# Patient Record
Sex: Female | Born: 1960 | Race: White | Hispanic: No | Marital: Married | Smoking: Never smoker
Health system: Southern US, Community
[De-identification: ages and names within clinical notes are randomized; demographics above are authoritative.]

## PROBLEM LIST (undated history)

## (undated) DIAGNOSIS — Z9889 Other specified postprocedural states: Secondary | ICD-10-CM

## (undated) DIAGNOSIS — I499 Cardiac arrhythmia, unspecified: Secondary | ICD-10-CM

## (undated) DIAGNOSIS — J45909 Unspecified asthma, uncomplicated: Secondary | ICD-10-CM

## (undated) DIAGNOSIS — M797 Fibromyalgia: Secondary | ICD-10-CM

## (undated) DIAGNOSIS — R51 Headache: Secondary | ICD-10-CM

## (undated) DIAGNOSIS — E876 Hypokalemia: Secondary | ICD-10-CM

## (undated) DIAGNOSIS — D649 Anemia, unspecified: Secondary | ICD-10-CM

## (undated) DIAGNOSIS — Z9289 Personal history of other medical treatment: Secondary | ICD-10-CM

## (undated) DIAGNOSIS — IMO0002 Reserved for concepts with insufficient information to code with codable children: Secondary | ICD-10-CM

## (undated) DIAGNOSIS — C8590 Non-Hodgkin lymphoma, unspecified, unspecified site: Secondary | ICD-10-CM

## (undated) DIAGNOSIS — N3010 Interstitial cystitis (chronic) without hematuria: Secondary | ICD-10-CM

## (undated) DIAGNOSIS — J449 Chronic obstructive pulmonary disease, unspecified: Secondary | ICD-10-CM

## (undated) HISTORY — PX: PORTACATH PLACEMENT: SHX2246

## (undated) HISTORY — DX: Interstitial cystitis (chronic) without hematuria: N30.10

## (undated) HISTORY — DX: Reserved for concepts with insufficient information to code with codable children: IMO0002

## (undated) HISTORY — PX: OTHER SURGICAL HISTORY: SHX169

## (undated) HISTORY — DX: Anemia, unspecified: D64.9

## (undated) HISTORY — PX: COLONOSCOPY: SHX174

## (undated) HISTORY — DX: Other specified postprocedural states: Z98.890

## (undated) HISTORY — DX: Unspecified asthma, uncomplicated: J45.909

## (undated) HISTORY — DX: Headache: R51

## (undated) HISTORY — DX: Chronic obstructive pulmonary disease, unspecified: J44.9

## (undated) HISTORY — DX: Fibromyalgia: M79.7

## (undated) SURGERY — Surgical Case
Anesthesia: *Unknown

---

## 2002-04-30 DIAGNOSIS — M797 Fibromyalgia: Secondary | ICD-10-CM

## 2002-04-30 DIAGNOSIS — N3010 Interstitial cystitis (chronic) without hematuria: Secondary | ICD-10-CM

## 2002-04-30 HISTORY — DX: Interstitial cystitis (chronic) without hematuria: N30.10

## 2002-04-30 HISTORY — DX: Fibromyalgia: M79.7

## 2002-07-20 ENCOUNTER — Ambulatory Visit (HOSPITAL_COMMUNITY): Admission: RE | Admit: 2002-07-20 | Discharge: 2002-07-20 | Payer: Self-pay | Admitting: Obstetrics & Gynecology

## 2002-07-20 ENCOUNTER — Encounter: Payer: Self-pay | Admitting: Obstetrics & Gynecology

## 2005-02-21 ENCOUNTER — Encounter: Admission: RE | Admit: 2005-02-21 | Discharge: 2005-02-21 | Payer: Self-pay | Admitting: Obstetrics & Gynecology

## 2006-02-14 ENCOUNTER — Ambulatory Visit (HOSPITAL_COMMUNITY): Admission: RE | Admit: 2006-02-14 | Discharge: 2006-02-14 | Payer: Self-pay | Admitting: Obstetrics & Gynecology

## 2006-04-30 DIAGNOSIS — G8929 Other chronic pain: Secondary | ICD-10-CM

## 2006-04-30 HISTORY — DX: Other chronic pain: G89.29

## 2007-06-06 ENCOUNTER — Encounter: Admission: RE | Admit: 2007-06-06 | Discharge: 2007-06-06 | Payer: Self-pay | Admitting: Obstetrics & Gynecology

## 2007-06-18 ENCOUNTER — Encounter: Admission: RE | Admit: 2007-06-18 | Discharge: 2007-06-18 | Payer: Self-pay | Admitting: Obstetrics & Gynecology

## 2008-04-30 DIAGNOSIS — C8590 Non-Hodgkin lymphoma, unspecified, unspecified site: Secondary | ICD-10-CM

## 2008-04-30 HISTORY — DX: Non-Hodgkin lymphoma, unspecified, unspecified site: C85.90

## 2009-02-23 ENCOUNTER — Encounter: Admission: RE | Admit: 2009-02-23 | Discharge: 2009-02-23 | Payer: Self-pay | Admitting: Obstetrics & Gynecology

## 2009-04-12 ENCOUNTER — Emergency Department (HOSPITAL_COMMUNITY): Admission: EM | Admit: 2009-04-12 | Discharge: 2009-04-12 | Payer: Self-pay | Admitting: Emergency Medicine

## 2009-04-30 DIAGNOSIS — Z9889 Other specified postprocedural states: Secondary | ICD-10-CM

## 2009-04-30 HISTORY — DX: Other specified postprocedural states: Z98.890

## 2009-04-30 HISTORY — PX: DILATION AND CURETTAGE OF UTERUS: SHX78

## 2009-06-10 ENCOUNTER — Ambulatory Visit (HOSPITAL_COMMUNITY): Admission: RE | Admit: 2009-06-10 | Discharge: 2009-06-10 | Payer: Self-pay | Admitting: Obstetrics & Gynecology

## 2010-04-10 ENCOUNTER — Other Ambulatory Visit
Admission: RE | Admit: 2010-04-10 | Discharge: 2010-04-10 | Payer: Self-pay | Source: Home / Self Care | Admitting: Vascular Surgery

## 2010-04-30 DIAGNOSIS — I499 Cardiac arrhythmia, unspecified: Secondary | ICD-10-CM

## 2010-04-30 HISTORY — DX: Cardiac arrhythmia, unspecified: I49.9

## 2010-05-21 ENCOUNTER — Encounter: Payer: Self-pay | Admitting: Obstetrics & Gynecology

## 2010-07-20 LAB — CBC
HCT: 38.2 % (ref 36.0–46.0)
Hemoglobin: 12.5 g/dL (ref 12.0–15.0)
MCHC: 32.7 g/dL (ref 30.0–36.0)
MCV: 79.4 fL (ref 78.0–100.0)
Platelets: 161 10*3/uL (ref 150–400)
RBC: 4.82 MIL/uL (ref 3.87–5.11)
RDW: 15.3 % (ref 11.5–15.5)
WBC: 5.3 10*3/uL (ref 4.0–10.5)

## 2010-08-01 LAB — URINALYSIS, ROUTINE W REFLEX MICROSCOPIC
Bilirubin Urine: NEGATIVE
Glucose, UA: NEGATIVE mg/dL
Hgb urine dipstick: NEGATIVE
Ketones, ur: 15 mg/dL — AB
Nitrite: NEGATIVE
Protein, ur: NEGATIVE mg/dL
Specific Gravity, Urine: 1.007 (ref 1.005–1.030)
Urobilinogen, UA: 0.2 mg/dL (ref 0.0–1.0)
pH: 8 (ref 5.0–8.0)

## 2010-08-01 LAB — COMPREHENSIVE METABOLIC PANEL
ALT: 13 U/L (ref 0–35)
AST: 17 U/L (ref 0–37)
Albumin: 3.9 g/dL (ref 3.5–5.2)
Alkaline Phosphatase: 95 U/L (ref 39–117)
BUN: 4 mg/dL — ABNORMAL LOW (ref 6–23)
CO2: 26 mEq/L (ref 19–32)
Calcium: 8.8 mg/dL (ref 8.4–10.5)
Chloride: 101 mEq/L (ref 96–112)
Creatinine, Ser: 0.89 mg/dL (ref 0.4–1.2)
GFR calc Af Amer: >60 mL/min (ref >60)
GFR calc non Af Amer: >60 mL/min (ref >60)
Glucose, Bld: 115 mg/dL — ABNORMAL HIGH (ref 70–99)
Potassium: 3.3 mEq/L — ABNORMAL LOW (ref 3.5–5.1)
Sodium: 136 mEq/L (ref 135–145)
Total Bilirubin: 0.8 mg/dL (ref 0.3–1.2)
Total Protein: 6.9 g/dL (ref 6.0–8.3)

## 2010-08-01 LAB — CBC
HCT: 39.2 % (ref 36.0–46.0)
Hemoglobin: 13.1 g/dL (ref 12.0–15.0)
MCHC: 33.5 g/dL (ref 30.0–36.0)
MCV: 79.7 fL (ref 78.0–100.0)
Platelets: 156 10*3/uL (ref 150–400)
RBC: 4.91 MIL/uL (ref 3.87–5.11)
RDW: 14.9 % (ref 11.5–15.5)
WBC: 8.6 10*3/uL (ref 4.0–10.5)

## 2010-08-01 LAB — DIFFERENTIAL
Basophils Absolute: 0 10*3/uL (ref 0.0–0.1)
Basophils Relative: 0 % (ref 0–1)
Eosinophils Absolute: 0 10*3/uL (ref 0.0–0.7)
Eosinophils Relative: 0 % (ref 0–5)
Lymphocytes Relative: 15 % (ref 12–46)
Lymphs Abs: 1.3 10*3/uL (ref 0.7–4.0)
Monocytes Absolute: 0.4 10*3/uL (ref 0.1–1.0)
Monocytes Relative: 5 % (ref 3–12)
Neutro Abs: 6.9 10*3/uL (ref 1.7–7.7)
Neutrophils Relative %: 80 % — ABNORMAL HIGH (ref 43–77)

## 2010-08-01 LAB — LIPASE, BLOOD: Lipase: 17 U/L (ref 11–59)

## 2010-09-21 ENCOUNTER — Other Ambulatory Visit: Payer: Self-pay | Admitting: Obstetrics & Gynecology

## 2010-09-21 DIAGNOSIS — Z1231 Encounter for screening mammogram for malignant neoplasm of breast: Secondary | ICD-10-CM

## 2010-09-27 ENCOUNTER — Ambulatory Visit
Admission: RE | Admit: 2010-09-27 | Discharge: 2010-09-27 | Disposition: A | Discharge status: Home / Self Care | Source: Ambulatory Visit | Attending: Obstetrics & Gynecology | Admitting: Obstetrics & Gynecology

## 2010-09-27 DIAGNOSIS — Z1231 Encounter for screening mammogram for malignant neoplasm of breast: Secondary | ICD-10-CM

## 2010-09-27 HISTORY — DX: Non-Hodgkin lymphoma, unspecified, unspecified site: C85.90

## 2011-04-12 DIAGNOSIS — IMO0002 Reserved for concepts with insufficient information to code with codable children: Secondary | ICD-10-CM

## 2011-04-12 DIAGNOSIS — R87613 High grade squamous intraepithelial lesion on cytologic smear of cervix (HGSIL): Secondary | ICD-10-CM

## 2011-04-12 HISTORY — DX: Reserved for concepts with insufficient information to code with codable children: IMO0002

## 2011-04-12 HISTORY — DX: High grade squamous intraepithelial lesion on cytologic smear of cervix (HGSIL): R87.613

## 2011-04-18 DIAGNOSIS — N3010 Interstitial cystitis (chronic) without hematuria: Secondary | ICD-10-CM

## 2011-04-18 HISTORY — DX: Interstitial cystitis (chronic) without hematuria: N30.10

## 2011-06-04 ENCOUNTER — Other Ambulatory Visit: Payer: Self-pay | Admitting: Obstetrics & Gynecology

## 2011-06-28 ENCOUNTER — Other Ambulatory Visit: Payer: Self-pay | Admitting: Obstetrics & Gynecology

## 2011-08-02 ENCOUNTER — Encounter: Payer: Self-pay | Admitting: Obstetrics and Gynecology

## 2011-08-08 ENCOUNTER — Encounter: Payer: Self-pay | Admitting: Obstetrics and Gynecology

## 2011-08-08 ENCOUNTER — Ambulatory Visit (INDEPENDENT_AMBULATORY_CARE_PROVIDER_SITE_OTHER): Admitting: Obstetrics and Gynecology

## 2011-08-08 VITALS — BP 110/60 | HR 80 | Temp 98.1°F | Resp 16 | Ht 61.0 in | Wt 141.0 lb

## 2011-08-08 DIAGNOSIS — R6889 Other general symptoms and signs: Secondary | ICD-10-CM

## 2011-08-08 DIAGNOSIS — IMO0002 Reserved for concepts with insufficient information to code with codable children: Secondary | ICD-10-CM | POA: Insufficient documentation

## 2011-08-08 DIAGNOSIS — C8589 Other specified types of non-Hodgkin lymphoma, extranodal and solid organ sites: Secondary | ICD-10-CM

## 2011-08-08 DIAGNOSIS — R87619 Unspecified abnormal cytological findings in specimens from cervix uteri: Secondary | ICD-10-CM | POA: Insufficient documentation

## 2011-08-08 DIAGNOSIS — C8590 Non-Hodgkin lymphoma, unspecified, unspecified site: Secondary | ICD-10-CM | POA: Insufficient documentation

## 2011-08-08 NOTE — Progress Notes (Signed)
Nl exam REPEAT PAP VISIT  Subjective:    Nicole Aguilar is a 51 y.o. female who presents as a new patient for consultation concerning abnormal paps. Review of her records reveal the following: 2004 Endometrial biopsy Normal 12/05 Pap   LGSIL 3/06 Cx Bx   LGSIL 3/06 ECC   Normal 10/06 Pap   LGSIL 10/06 GC/CHL  NEG 10/07 Pap   ASCUS 10/07 HR HPV  Detected 12/07 ECC   Squamous metaplasia 12/09 PAP   Normal 12/09 GC/CHL  NEG 10/10 Pap   ASCUS 10/10 HR HPV  Not Detected 2/11 Endomet curette Normal 5/12 Pap   ASCUS 5/12 HR HPV  Not Detected 12/12 Pap   HGSIL 2/13 CX BX   No Dysplasia 2/13 ECC   Neg CKC RECOMMENDED by GYN because of discrepancy between pap and biopsies       Objective:    External genitalia: normal Perianal skin: no external genital warts noted Vagina: normal without discharge Cervix: normal in appearance Uterus:  Normal sized, shape and consistency Adnexa:  No masses  Assessment and Plan:   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  Prior to that, most significant lesion was LGSIL. Pap/biopsy discrepancy with no dyplasia on most recent biopsy Single episode of HR HPV in 2007 with two negative screens since.  PLAN: HR HPV done at visit If positive, will proceed with CKC.  If neg, recommend colpo All pap records ordered  [facility] PMD 4/19/20132:47 PM

## 2011-08-08 NOTE — Patient Instructions (Signed)
PT NEEDS TO SIGN roi @ CHECKOUT

## 2011-08-10 ENCOUNTER — Encounter (HOSPITAL_COMMUNITY): Admission: RE | Payer: Self-pay | Source: Ambulatory Visit

## 2011-08-10 ENCOUNTER — Ambulatory Visit (HOSPITAL_COMMUNITY): Admission: RE | Admit: 2011-08-10 | Source: Ambulatory Visit | Admitting: Obstetrics & Gynecology

## 2011-08-10 SURGERY — CONE BIOPSY, CERVIX
Anesthesia: Choice

## 2011-08-17 ENCOUNTER — Encounter: Payer: Self-pay | Admitting: Obstetrics and Gynecology

## 2011-08-22 ENCOUNTER — Telehealth: Payer: Self-pay | Admitting: Obstetrics and Gynecology

## 2011-08-23 NOTE — Telephone Encounter (Signed)
Pc from pt. Told pt consulted with [facility] and rec colposcopy. HPV-not detected. Pt agrees. Appt sched 09/04/11@4 :15 with [facility]. Informed pt no IC or anything pv 24 prior to appt and can take OTC Tylenol or Ibuprofen prior to appt. Pt voices understanding.

## 2011-08-23 NOTE — Telephone Encounter (Signed)
Pt is electronic/no cht/ seen after 08/07/11

## 2011-08-23 NOTE — Telephone Encounter (Signed)
Lm on vm to cb per [facility] recs.

## 2011-08-23 NOTE — Telephone Encounter (Signed)
SEE TELEPHONE MESSAGE

## 2011-09-04 ENCOUNTER — Ambulatory Visit (INDEPENDENT_AMBULATORY_CARE_PROVIDER_SITE_OTHER): Admitting: Obstetrics and Gynecology

## 2011-09-04 ENCOUNTER — Encounter: Payer: Self-pay | Admitting: Obstetrics and Gynecology

## 2011-09-04 ENCOUNTER — Encounter

## 2011-09-04 VITALS — BP 90/60 | HR 70 | Resp 16 | Ht 61.0 in | Wt 146.0 lb

## 2011-09-04 DIAGNOSIS — R6889 Other general symptoms and signs: Secondary | ICD-10-CM

## 2011-09-04 DIAGNOSIS — IMO0002 Reserved for concepts with insufficient information to code with codable children: Secondary | ICD-10-CM

## 2011-09-04 NOTE — Patient Instructions (Signed)
Colposcopy Care After Colposcopy is a procedure in which a special tool is used to magnify the surface of the cervix. A tissue sample (biopsy) may also be taken. This sample will be looked at for cervical cancer or other problems. After the test:  You may have some cramping.   Lie down for a few minutes if you feel lightheaded.    You may have some bleeding which should stop in a few days.  HOME CARE  Do not have sex or use tampons for 2 to 3 days or as told.   Only take medicine as told by your doctor.   Continue to take your birth control pills as usual.  Finding out the results of your test Ask when your test results will be ready. Make sure you get your test results. GET HELP RIGHT AWAY IF:  You are bleeding a lot or are passing blood clots.   You develop a fever of 102 F (38.9 C) or higher.   You have abnormal vaginal discharge.   You have cramps that do not go away with medicine.   You feel lightheaded, dizzy, or pass out (faint).  MAKE SURE YOU:   Understand these instructions.   Will watch your condition.   Will get help right away if you are not doing well or get worse.  Document Released: 10/03/2007 Document Revised: 04/05/2011 Document Reviewed: 10/03/2007 Exit[employer] Patient Information 2012 [employer]. 

## 2011-09-04 NOTE — Progress Notes (Signed)
Colposcopy Procedure Note  Indications: Pap smear on December 2012 showed: high-grade squamous intraepithelial neoplasia  (HGSIL-encompassing moderate and severe dysplasia). Previous colposcopy: normal exam without visible pathology and in 2/13. Prior cervical treatment: no treatment. HR HPV from 4/13=not detected.  Procedure Details  The risks and benefits of the procedure and Written informed consent obtained.  Speculum placed in vagina and excellent visualization of cervix achieved, cervix swabbed x 3 with acetic acid solution.  Findings: Cervix: no visible lesions; endocervical speculum placed, endocervical curettage performed, cervical biopsies taken at 2 o'clock and specimen labelled and sent to pathology. Vaginal inspection: vaginal colposcopy not performed. Vulvar colposcopy: vulvar colposcopy not performed.  Specimens: 2 o'clock biopsy and ECC  Complications: none.  Plan: Specimens labelled and sent to Pathology. Telephone pt with results and F/U If bx is neg or LGSIL, will plan pap in 8/13.  If HGSIL will plan CKC  [facility] PMD

## 2011-09-17 ENCOUNTER — Telehealth: Payer: Self-pay

## 2011-09-17 NOTE — Telephone Encounter (Signed)
Tc to pt per colposcopy results. Told pt ECC=abnormal. Needs CKC per v[facility]. Pt aware surgery scheduler will call pt to sched appt. Pt voices understanding.

## 2011-09-20 ENCOUNTER — Telehealth: Payer: Self-pay | Admitting: Obstetrics and Gynecology

## 2011-09-27 ENCOUNTER — Other Ambulatory Visit: Payer: Self-pay | Admitting: Obstetrics and Gynecology

## 2011-09-27 ENCOUNTER — Telehealth: Payer: Self-pay | Admitting: Obstetrics and Gynecology

## 2011-09-27 NOTE — Telephone Encounter (Signed)
CKC scheduled for 10/19/11 @ 9:30 with [facility].  [employer] effective 08/29/11 will pay 100% as out of pocket max has been met per [place] [employer] on 09/25/11 -Adrianne Pridgen

## 2011-10-01 ENCOUNTER — Encounter (HOSPITAL_COMMUNITY): Payer: Self-pay | Admitting: Pharmacist

## 2011-10-08 ENCOUNTER — Encounter: Payer: Self-pay | Admitting: Obstetrics and Gynecology

## 2011-10-08 ENCOUNTER — Encounter (HOSPITAL_COMMUNITY)
Admission: RE | Admit: 2011-10-08 | Discharge: 2011-10-08 | Disposition: A | Discharge status: Home / Self Care | Source: Ambulatory Visit | Attending: Obstetrics and Gynecology | Admitting: Obstetrics and Gynecology

## 2011-10-08 ENCOUNTER — Ambulatory Visit (INDEPENDENT_AMBULATORY_CARE_PROVIDER_SITE_OTHER): Admitting: Obstetrics and Gynecology

## 2011-10-08 ENCOUNTER — Other Ambulatory Visit: Payer: Self-pay

## 2011-10-08 ENCOUNTER — Encounter (HOSPITAL_COMMUNITY): Payer: Self-pay

## 2011-10-08 VITALS — BP 102/60 | Temp 98.1°F | Ht 61.0 in | Wt 148.0 lb

## 2011-10-08 DIAGNOSIS — R519 Headache, unspecified: Secondary | ICD-10-CM

## 2011-10-08 DIAGNOSIS — G43709 Chronic migraine without aura, not intractable, without status migrainosus: Secondary | ICD-10-CM | POA: Insufficient documentation

## 2011-10-08 DIAGNOSIS — R6889 Other general symptoms and signs: Secondary | ICD-10-CM

## 2011-10-08 DIAGNOSIS — N951 Menopausal and female climacteric states: Secondary | ICD-10-CM | POA: Insufficient documentation

## 2011-10-08 DIAGNOSIS — R51 Headache: Secondary | ICD-10-CM

## 2011-10-08 HISTORY — DX: Hypokalemia: E87.6

## 2011-10-08 HISTORY — DX: Headache, unspecified: R51.9

## 2011-10-08 HISTORY — DX: Cardiac arrhythmia, unspecified: I49.9

## 2011-10-08 HISTORY — DX: Chronic migraine without aura, not intractable, without status migrainosus: G43.709

## 2011-10-08 HISTORY — DX: Personal history of other medical treatment: Z92.89

## 2011-10-08 LAB — BASIC METABOLIC PANEL
BUN: 11 mg/dL (ref 6–23)
Chloride: 100 mEq/L (ref 96–112)
Creatinine, Ser: 0.87 mg/dL (ref 0.50–1.10)
GFR calc Af Amer: 88 mL/min — ABNORMAL LOW (ref >90)
GFR calc non Af Amer: 76 mL/min — ABNORMAL LOW (ref >90)
Potassium: 4.2 mEq/L (ref 3.5–5.1)

## 2011-10-08 LAB — CBC
MCHC: 33.5 g/dL (ref 30.0–36.0)
Platelets: 125 10*3/uL — ABNORMAL LOW (ref 150–400)
RDW: 13 % (ref 11.5–15.5)
WBC: 3.8 10*3/uL — ABNORMAL LOW (ref 4.0–10.5)

## 2011-10-08 MED ORDER — HYDROCODONE-ACETAMINOPHEN 5-500 MG PO TABS: ORAL_TABLET | ORAL | Status: DC

## 2011-10-08 NOTE — Progress Notes (Signed)
Nicole Aguilar is a 51 year old white married female gravida 4 para2 who presents for preoperative examination.  Pt here today for Pre-Op for CKC for endocervical dysplasia at last colposcopyon 5/7/ 2013.the cervical biopsies showed only koilocytic atypia after a Pap smear showing high-grade squamous intraepithelial lesion. Review of her records reveal the following:  2004 Endometrial biopsy Normal  12/05 Pap LGSIL  3/06 Cx Bx LGSIL  3/06 ECC Normal  10/06 Pap LGSIL  10/06 GC/CHL NEG  10/07 Pap ASCUS  10/07 HR HPV Detected  12/07 ECC Squamous metaplasia  12/09 PAP Normal  12/09 GC/CHL NEG  10/10 Pap ASCUS  10/10 HR HPV Not Detected  2/11 Endomet curette Normal  5/12 Pap ASCUS  5/12 HR HPV Not Detected  12/12 Pap HGSIL  2/13 CX BX No Dysplasia  2/13 ECC Neg  Her second gynecologic concern is that of menopausal symptoms which have not been well relieved by either the CombiPatch ordered now Climara pro. She continues to have significant night sweats which cause her difficulty with sleep. Her night sweats have been especially severe since 2008. These also were seen to have been accompanied by the onset of headaches at that time which do not wax and wane during the month and are intermittent had one episode of severe orgasmic had a with some visual changes.  Pertinent Gynecological History: Menses: post-menopausal Bleeding: none DES exposure: denies Blood transfusions: see below Sexually transmitted diseases: no past history Previous GYN Procedures: DNC  Last mammogram: normal    Menstrual History:  Patient's last menstrual period was 02/28/2010.    Past Medical History  Diagnosis Date  . 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 Anemia   . Abnormal Pap smear, high grade squamous intraepithelial lesion (HGSIL) 04/12/11  . History of dilation and curettage 2011  . Chronic headaches 2008  . Interstitial cystitis 2004    Bladder instillations with Dr. Logan Bores  .  Fibromyalgia 2004    Dr. Logan Bores manages  . History of blood transfusion 1980s  . Dysrhythmia 2012    Holter monitor showed occ atrial flutter- no meds prescribed  . 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 Dilation and curettage of uterus 2011  . Colonoscopy   . Portacath placement     used for chemo.Marland KitchenMarland KitchenNOT for IV access    Family History  Problem Relation Age of Onset  . Heart disease Father   . Stroke Father   . Alzheimer's disease Father   . Diabetes Maternal Uncle   . Heart disease Maternal Grandmother   . Cancer Maternal Grandmother     kidney  . Heart disease Maternal Grandfather   . Heart disease Paternal Grandmother   . Cancer Paternal Aunt     bone & breast  . Alzheimer's disease Paternal Aunt     Social History:  reports that she has never smoked. She has never used smokeless tobacco. She reports that she does not drink alcohol or use illicit drugs.  Allergies:  Allergies  Allergen Reactions  . Shellfish Allergy Palpitations    Can use Betadine without difficulty    Review of Systems  Constitutional: Positive for diaphoresis. Negative for fever, chills, weight loss and malaise/fatigue.  HENT: Negative for hearing loss, ear pain, nosebleeds, congestion, sore throat, neck pain, tinnitus and ear discharge.   Eyes: Positive for photophobia. Negative for blurred vision, double vision,  pain, discharge and redness.       With headaches  Respiratory: Negative for cough, hemoptysis, sputum production, shortness of breath, wheezing and stridor.   Cardiovascular: Positive for palpitations. Negative for chest pain, orthopnea, claudication, leg swelling and PND.       Hx intermittent atrial flutter   Gastrointestinal: Negative for heartburn, nausea, vomiting, abdominal pain, diarrhea, constipation, blood in stool and melena.  Genitourinary: Positive for dysuria, urgency and frequency. Negative for hematuria  and flank pain.       History of interstitial cystitis  Musculoskeletal: Positive for myalgias. Negative for back pain, joint pain and falls.       History of fibromyalgia  Skin: Negative for itching and rash.  Neurological: Positive for headaches. Negative for dizziness, tingling, tremors, sensory change, speech change, focal weakness, seizures, loss of consciousness and weakness.  Endo/Heme/Allergies: Negative for environmental allergies and polydipsia. Does not bruise/bleed easily.  Psychiatric/Behavioral: Negative.     Blood pressure 102/60, temperature 98.1 F (36.7 C), temperature source Oral, height 5\' 1"  (1.549 m), weight 148 lb (67.132 kg), last menstrual period 02/28/2010.  Physical Exam  Constitutional: She is oriented to person, place, and time and well-developed, well-nourished, and in no distress.  HENT:  Head: Normocephalic and atraumatic.  Eyes: Conjunctivae and EOM are normal.  Neck: Normal range of motion. Neck supple.  Cardiovascular: Normal rate and regular rhythm.   Pulmonary/Chest: Effort normal and breath sounds normal.  Abdominal: Soft. Bowel sounds are normal.  Genitourinary: Vagina normal, uterus normal, cervix normal, right adnexa normal and left adnexa normal.  Musculoskeletal: Normal range of motion.  Neurological: She is alert and oriented to person, place, and time. Gait normal.  Skin: Skin is warm and dry.      Assessment/Plan Abnormal genital cytology with discordance between Pap smear and biopsies and most recently endocervical dysplasia Postmenopausal vasomotor symptoms Fibromyalgia Interstitial cystitis Headaches which seemed to have their onset at about the time of menopause  Recommendation Cold knife conization of the cervix is recommend.  The risks of anesthesia bleeding infection and damage to adjacent organs were all reviewed. The patient wishes to proceed. This will be performed on 621 2013 at [facility] Once the pathology returns  from cold knife conization the patient's hormone replacement therapy will be reviewed to allow increases in estrogen and to begin micronized progesterone 100 mg p.o. Q.h.s.  Carson Meche P 10/08/2011, 9:26 PM

## 2011-10-08 NOTE — H&P (Signed)
Nicole Aguilar is a 51 year old white married female gravida 4 para2 who presents for preoperative examination.  Pt here today for Pre-Op for CKC for endocervical dysplasia at last colposcopyon 5/7/ 2013.the cervical biopsies showed only koilocytic atypia after a Pap smear showing high-grade squamous intraepithelial lesion. Review of her records reveal the following:  2004 Endometrial biopsy Normal  12/05 Pap LGSIL  3/06 Cx Bx LGSIL  3/06 ECC Normal  10/06 Pap LGSIL  10/06 GC/CHL NEG  10/07 Pap ASCUS  10/07 HR HPV Detected  12/07 ECC Squamous metaplasia  12/09 PAP Normal  12/09 GC/CHL NEG  10/10 Pap ASCUS  10/10 HR HPV Not Detected  2/11 Endomet curette Normal  5/12 Pap ASCUS  5/12 HR HPV Not Detected  12/12 Pap HGSIL  2/13 CX BX No Dysplasia  2/13 ECC Neg 5/13 ECC CIN I, mild dysplasia 5/13 Cx Bx neg  Her second gynecologic concern is that of menopausal symptoms which have not been well relieved by either the CombiPatch ordered now Climara pro. She continues to have significant night sweats which cause her difficulty with sleep. Her night sweats have been especially severe since 2008. These also were seen to have been accompanied by the onset of headaches at that time which do not wax and wane during the month and are intermittent had one episode of severe orgasmic had a with some visual changes.  Pertinent Gynecological History: Menses: post-menopausal Bleeding: none DES exposure: denies Blood transfusions: see below Sexually transmitted diseases: no past history Previous GYN Procedures: DNC  Last mammogram: normal    Menstrual History:  Patient's last menstrual period was 02/28/2010.    Past Medical History  Diagnosis Date  . 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 Anemia   . Abnormal Pap smear, high grade squamous intraepithelial lesion (HGSIL) 04/12/11  . History of dilation and curettage 2011  . Chronic headaches 2008  . Interstitial cystitis 2004      Bladder instillations with Dr. Logan Bores  . Fibromyalgia 2004    Dr. Logan Bores manages  . History of blood transfusion 1980s  . Dysrhythmia 2012    Holter monitor showed occ atrial flutter- no meds prescribed  . 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 Dilation and curettage of uterus 2011  . Colonoscopy   . Portacath placement     used for chemo.Marland KitchenMarland KitchenNOT for IV access    Family History  Problem Relation Age of Onset  . Heart disease Father   . Stroke Father   . Alzheimer's disease Father   . Diabetes Maternal Uncle   . Heart disease Maternal Grandmother   . Cancer Maternal Grandmother     kidney  . Heart disease Maternal Grandfather   . Heart disease Paternal Grandmother   . Cancer Paternal Aunt     bone & breast  . Alzheimer's disease Paternal Aunt     Social History:  reports that she has never smoked. She has never used smokeless tobacco. She reports that she does not drink alcohol or use illicit drugs.  Allergies:  Allergies  Allergen Reactions  . Shellfish Allergy Palpitations    Can use Betadine without difficulty    Review of Systems  Constitutional: Positive for diaphoresis. Negative for fever, chills, weight loss and malaise/fatigue.  HENT: Negative for hearing loss, ear pain, nosebleeds, congestion, sore throat, neck pain, tinnitus and ear discharge.  Eyes: Positive for photophobia. Negative for blurred vision, double vision, pain, discharge and redness.       With headaches  Respiratory: Negative for cough, hemoptysis, sputum production, shortness of breath, wheezing and stridor.   Cardiovascular: Positive for palpitations. Negative for chest pain, orthopnea, claudication, leg swelling and PND.       Hx intermittent atrial flutter   Gastrointestinal: Negative for heartburn, nausea, vomiting, abdominal pain, diarrhea, constipation, blood in stool and melena.  Genitourinary: Positive for dysuria,  urgency and frequency. Negative for hematuria and flank pain.       History of interstitial cystitis  Musculoskeletal: Positive for myalgias. Negative for back pain, joint pain and falls.       History of fibromyalgia  Skin: Negative for itching and rash.  Neurological: Positive for headaches. Negative for dizziness, tingling, tremors, sensory change, speech change, focal weakness, seizures, loss of consciousness and weakness.  Endo/Heme/Allergies: Negative for environmental allergies and polydipsia. Does not bruise/bleed easily.  Psychiatric/Behavioral: Negative.     Blood pressure 102/60, temperature 98.1 F (36.7 C), temperature source Oral, height 5\' 1"  (1.549 m), weight 148 lb (67.132 kg), last menstrual period 02/28/2010. BP 111/76  Pulse 62  Temp 97.9 F (36.6 C) (Oral)  Resp 18  SpO2 100%  LMP 02/28/2010 on 10/19/11  Physical Exam  Constitutional: She is oriented to person, place, and time and well-developed, well-nourished, and in no distress.  HENT:  Head: Normocephalic and atraumatic.  Eyes: Conjunctivae and EOM are normal.  Neck: Normal range of motion. Neck supple.  Cardiovascular: Normal rate and regular rhythm.   Pulmonary/Chest: Effort normal and breath sounds normal.  Abdominal: Soft. Bowel sounds are normal.  Genitourinary: Vagina normal, uterus normal, cervix normal, right adnexa normal and left adnexa normal.  Musculoskeletal: Normal range of motion.  Neurological: She is alert and oriented to person, place, and time. Gait normal.  Skin: Skin is warm and dry.      Assessment/Plan Abnormal genital cytology with discordance between Pap smear and biopsies and most recently endocervical dysplasia Postmenopausal vasomotor symptoms Fibromyalgia Interstitial cystitis Headaches which seemed to have their onset at about the time of menopause  Recommendation Cold knife conization of the cervix is recommend.  The risks of anesthesia bleeding infection and damage  to adjacent organs were all reviewed. The patient wishes to proceed. This will be performed on 6/21/ 2013 at [facility] Once the pathology returns from cold knife conization the patient's hormone replacement therapy will be reviewed to allow increases in estrogen and to begin micronized progesterone 100 mg p.o. Q.h.s. Pre op assessment and cardiac echo done on 10/16/11 revealed pt an acceptable candidate for surgery.  Cadence Minton P 10/08/2011, 9:26 PM

## 2011-10-08 NOTE — Pre-Procedure Instructions (Signed)
I reported abnormal EKG to Dr. Rodman Pickle and she wants pt to get cardiology consult prior to surgery unless today's EKG is same as EKG in cardio office. I called [facility] in [place] and spoke with Dr. Albertine Patricia nurse who gave info to Dr. Kirtland Bouchard and he wants pt to see him on 10/16/11. ( office EKG doesn't show same as the one today) I called pt and gave her the appt time.

## 2011-10-08 NOTE — Patient Instructions (Signed)
YOUR PROCEDURE IS SCHEDULED ON:10/19/11  ENTER THROUGH THE MAIN ENTRANCE OF [facility] AT:8am  USE DESK PHONE AND DIAL [id number] TO INFORM us OF YOUR ARRIVAL  CALL [phone] IF YOU HAVE ANY QUESTIONS OR PROBLEMS PRIOR TO YOUR ARRIVAL.  REMEMBER: DO NOT EAT OR DRINK AFTER MIDNIGHT :Thursday   YOU MAY BRUSH YOUR TEETH THE MORNING OF SURGERY   TAKE THESE MEDICINES THE DAY OF SURGERY WITH SIP OF WATER:usual am meds   DO NOT WEAR JEWELRY, EYE MAKEUP, LIPSTICK OR DARK FINGERNAIL POLISH DO NOT WEAR LOTIONS  DO NOT SHAVE FOR 48 HOURS PRIOR TO SURGERY  YOU WILL NOT BE ALLOWED TO DRIVE YOURSELF HOME.  NAME OF DRIVER:Ward Audria Nine

## 2011-10-19 ENCOUNTER — Ambulatory Visit (HOSPITAL_COMMUNITY)
Admission: RE | Admit: 2011-10-19 | Discharge: 2011-10-19 | Disposition: A | Discharge status: Home / Self Care | Source: Ambulatory Visit | Attending: Obstetrics and Gynecology | Admitting: Obstetrics and Gynecology

## 2011-10-19 ENCOUNTER — Encounter (HOSPITAL_COMMUNITY): Payer: Self-pay | Admitting: *Deleted

## 2011-10-19 ENCOUNTER — Encounter (HOSPITAL_COMMUNITY): Payer: Self-pay | Admitting: Anesthesiology

## 2011-10-19 ENCOUNTER — Encounter (HOSPITAL_COMMUNITY)
Admission: RE | Disposition: A | Discharge status: Home / Self Care | Payer: Self-pay | Source: Ambulatory Visit | Attending: Obstetrics and Gynecology

## 2011-10-19 ENCOUNTER — Ambulatory Visit (HOSPITAL_COMMUNITY): Admitting: Anesthesiology

## 2011-10-19 DIAGNOSIS — N870 Mild cervical dysplasia: Secondary | ICD-10-CM | POA: Insufficient documentation

## 2011-10-19 DIAGNOSIS — N879 Dysplasia of cervix uteri, unspecified: Secondary | ICD-10-CM

## 2011-10-19 HISTORY — PX: CERVICAL CONIZATION W/BX: SHX1330

## 2011-10-19 LAB — PREGNANCY, URINE: Preg Test, Ur: NEGATIVE

## 2011-10-19 SURGERY — CONE BIOPSY, CERVIX
Anesthesia: General | Site: Cervix | Wound class: Clean Contaminated

## 2011-10-19 MED ORDER — IBUPROFEN 600 MG PO TABS: 600.0000 mg | ORAL_TABLET | Freq: Four times a day (QID) | ORAL | Status: AC

## 2011-10-19 MED ORDER — PROPOFOL 10 MG/ML IV EMUL
INTRAVENOUS | Status: DC | PRN
  Administered 2011-10-19: 150 mg via INTRAVENOUS
  Administered 2011-10-19: 50 mg via INTRAVENOUS

## 2011-10-19 MED ORDER — KETOROLAC TROMETHAMINE 30 MG/ML IJ SOLN: 15.0000 mg | Freq: Once | INTRAMUSCULAR | Status: DC | PRN

## 2011-10-19 MED ORDER — KETOROLAC TROMETHAMINE 60 MG/2ML IM SOLN
INTRAMUSCULAR | Status: AC
  Filled 2011-10-19: qty 2

## 2011-10-19 MED ORDER — FENTANYL CITRATE 0.05 MG/ML IJ SOLN: 25.0000 ug | INTRAMUSCULAR | Status: DC | PRN

## 2011-10-19 MED ORDER — LACTATED RINGERS IV SOLN
INTRAVENOUS | Status: DC
  Administered 2011-10-19 (×2): via INTRAVENOUS

## 2011-10-19 MED ORDER — LIDOCAINE HCL (CARDIAC) 20 MG/ML IV SOLN
INTRAVENOUS | Status: DC | PRN
  Administered 2011-10-19: 60 mg via INTRAVENOUS

## 2011-10-19 MED ORDER — KETOROLAC TROMETHAMINE 60 MG/2ML IM SOLN
INTRAMUSCULAR | Status: DC | PRN
  Administered 2011-10-19: 30 mg via INTRAMUSCULAR

## 2011-10-19 MED ORDER — ONDANSETRON HCL 4 MG/2ML IJ SOLN
INTRAMUSCULAR | Status: DC | PRN
  Administered 2011-10-19: 4 mg via INTRAVENOUS

## 2011-10-19 MED ORDER — MIDAZOLAM HCL 5 MG/5ML IJ SOLN
INTRAMUSCULAR | Status: DC | PRN
  Administered 2011-10-19: 2 mg via INTRAVENOUS

## 2011-10-19 MED ORDER — DEXAMETHASONE SODIUM PHOSPHATE 4 MG/ML IJ SOLN
INTRAMUSCULAR | Status: DC | PRN
  Administered 2011-10-19: 10 mg via INTRAVENOUS

## 2011-10-19 MED ORDER — FENTANYL CITRATE 0.05 MG/ML IJ SOLN
INTRAMUSCULAR | Status: AC
  Filled 2011-10-19: qty 2

## 2011-10-19 MED ORDER — EPHEDRINE SULFATE 50 MG/ML IJ SOLN
INTRAMUSCULAR | Status: DC | PRN
  Administered 2011-10-19: 5 mg via INTRAVENOUS

## 2011-10-19 MED ORDER — LIDOCAINE HCL 2 % IJ SOLN
INTRAMUSCULAR | Status: DC | PRN
  Administered 2011-10-19: 10 mL

## 2011-10-19 MED ORDER — PROPOFOL 10 MG/ML IV EMUL
INTRAVENOUS | Status: AC
  Filled 2011-10-19: qty 20

## 2011-10-19 MED ORDER — LIDOCAINE HCL (CARDIAC) 20 MG/ML IV SOLN
INTRAVENOUS | Status: AC
  Filled 2011-10-19: qty 5

## 2011-10-19 MED ORDER — VASOPRESSIN 20 UNIT/ML IJ SOLN
INTRAVENOUS | Status: DC | PRN
  Administered 2011-10-19: 11:00:00 via INTRAMUSCULAR

## 2011-10-19 MED ORDER — DEXAMETHASONE SODIUM PHOSPHATE 10 MG/ML IJ SOLN
INTRAMUSCULAR | Status: AC
  Filled 2011-10-19: qty 1

## 2011-10-19 MED ORDER — LIDOCAINE HCL 2 % IJ SOLN
INTRAMUSCULAR | Status: AC
  Filled 2011-10-19: qty 1

## 2011-10-19 MED ORDER — IODINE STRONG (LUGOLS) 5 % PO SOLN
ORAL | Status: DC | PRN
  Administered 2011-10-19: 0.2 mL via ORAL

## 2011-10-19 MED ORDER — FENTANYL CITRATE 0.05 MG/ML IJ SOLN
INTRAMUSCULAR | Status: DC | PRN
  Administered 2011-10-19 (×2): 100 ug via INTRAVENOUS

## 2011-10-19 MED ORDER — MIDAZOLAM HCL 2 MG/2ML IJ SOLN
INTRAMUSCULAR | Status: AC
  Filled 2011-10-19: qty 2

## 2011-10-19 MED ORDER — GLYCOPYRROLATE 0.2 MG/ML IJ SOLN
INTRAMUSCULAR | Status: AC
  Filled 2011-10-19: qty 1

## 2011-10-19 MED ORDER — ONDANSETRON HCL 4 MG/2ML IJ SOLN
INTRAMUSCULAR | Status: AC
  Filled 2011-10-19: qty 2

## 2011-10-19 MED ORDER — VASOPRESSIN 20 UNIT/ML IJ SOLN
INTRAMUSCULAR | Status: AC
  Filled 2011-10-19: qty 1

## 2011-10-19 MED ORDER — KETOROLAC TROMETHAMINE 30 MG/ML IJ SOLN
INTRAMUSCULAR | Status: DC | PRN
  Administered 2011-10-19: 30 mg via INTRAVENOUS

## 2011-10-19 MED ORDER — EPHEDRINE 5 MG/ML INJ
INTRAVENOUS | Status: AC
  Filled 2011-10-19: qty 10

## 2011-10-19 SURGICAL SUPPLY — 27 items
APPLICATOR COTTON TIP 6IN STRL (MISCELLANEOUS) ×6 IMPLANT
BLADE SURG 11 STRL SS (BLADE) ×2 IMPLANT
CLOTH BEACON ORANGE TIMEOUT ST (SAFETY) ×2 IMPLANT
CONTAINER PREFILL 10% NBF 60ML (FORM) ×2 IMPLANT
COUNTER NEEDLE 1200 MAGNETIC (NEEDLE) ×2 IMPLANT
DILATOR CANAL MILEX (MISCELLANEOUS) ×2 IMPLANT
ELECT LLETZ BALL 3MM DISP (ELECTRODE) IMPLANT
ELECT REM PT RETURN 9FT ADLT (ELECTROSURGICAL)
ELECTRODE REM PT RTRN 9FT ADLT (ELECTROSURGICAL) IMPLANT
GAUZE SPONGE 4X4 16PLY XRAY LF (GAUZE/BANDAGES/DRESSINGS) IMPLANT
GLOVE SURG SS PI 6.5 STRL IVOR (GLOVE) ×4 IMPLANT
GOWN PREVENTION PLUS LG XLONG (DISPOSABLE) ×4 IMPLANT
NEEDLE SPNL 22GX3.5 QUINCKE BK (NEEDLE) ×4 IMPLANT
PACK VAGINAL MINOR WOMEN LF (CUSTOM PROCEDURE TRAY) ×2 IMPLANT
PENCIL BUTTON HOLSTER BLD 10FT (ELECTRODE) IMPLANT
SCOPETTES 8  STERILE (MISCELLANEOUS) ×2
SCOPETTES 8 STERILE (MISCELLANEOUS) ×2 IMPLANT
SPONGE SURGIFOAM ABS GEL 12-7 (HEMOSTASIS) ×2 IMPLANT
SUT VIC AB 0 CT1 18XCR BRD8 (SUTURE) ×1 IMPLANT
SUT VIC AB 0 CT1 8-18 (SUTURE)
SUT VIC AB 0 CT2 27 (SUTURE) IMPLANT
SYR CONTROL 10ML LL (SYRINGE) ×4 IMPLANT
SYR TB 1ML 27GX1/2 SAFE (SYRINGE) ×2 IMPLANT
SYR TB 1ML 27GX1/2 SAFETY (SYRINGE) ×4
TOWEL OR 17X24 6PK STRL BLUE (TOWEL DISPOSABLE) ×4 IMPLANT
TUBING NON-CON 1/4 X 20 CONN (TUBING) ×2 IMPLANT
YANKAUER SUCT BULB TIP NO VENT (SUCTIONS) ×2 IMPLANT

## 2011-10-19 NOTE — Discharge Instructions (Signed)
Conization of the Cervix Conization is the cutting (excision) of a cone-shaped portion of the cervix. This procedure is usually done when there is abnormal bleeding from the cervix. It can also be done to evaluate an abnormal Pap smear or if an abnormality is seen on the cervix during an exam. Conization of the cervix is not done during a menstrual period or pregnancy. BEFORE THE PROCEDURE  Do not eat or drink anything for 6 to 8 hours before the procedure, especially if you are going to be given a drug to make you sleep (general anesthetic).   Do not take aspirin or blood thinners for at least a week before the procedure, or as directed.   Arrive at least an hour before the procedure to read and sign the necessary forms.   Arrange for someone to take you home after the procedure.   If you smoke, do not smoke for 2 weeks before the procedure.  LET YOUR CAREGIVER KNOW THE FOLLOWING:  Allergies to food or medications.   All the medications you are taking including over-the-counter and prescription medications, herbs, eyedrops, and creams.   You develop a cold or an infection.   If you are using illegal drugs or drinking too much alcohol.   Your smoking habits.   Previous problems with anesthetics including novocaine.   The possibility of being pregnant.   History of blood clots or other bleeding problems.   Other medical problems.  RISKS AND COMPLICATIONS   Bleeding.   Infection.   Damage to the cervix.   Injury to surrounding organs.   Problems with the anesthesia.  PROCEDURE Conization of the cervix can be done by:  Cold knife. This type cuts out the cervical canal and the transformation zone (where the normal cells end and the abnormal cells begin) with a scalpel.   LEEP (electrocautery). This type is done with a thin wire that can cut and burn (cauterize) the cervical tissue with an electrical current.   Laser treatment. This type cuts and burns (cauterizes) the  tissue of the cervix to prevent bleeding with a laser beam.  You will be given a drug to make you sleep (general anesthetic) for cold knife and laser treatments, and you will be given a numbing medication (local anesthetic) for the LEEP procedure. Conization is usually done using colposcopy. Colposcopy magnifies the cervix to see it more clearly. The tissue removed is examined under a microscope by a doctor (pathologist). The pathologist will provide a report to your caregiver. This will help your caregiver decide if further treatment is necessary. This report will also help your caregiver decide on the best treatment if your results are abnormal.  AFTER THE PROCEDURE  If you had a general anesthetic, you may be groggy for a couple of hours after the procedure.   If you had a local anesthetic, you will be advised to rest at the surgical center or caregiver's office until you are stable and feel ready to go home.   Have someone take you home.   You may have some cramping for a couple of days.   You may have a bloody discharge or light bleeding for 2 to 4 weeks.   You may have a black discharge coming from the vagina. This is from the paste used on the cervix to prevent bleeding. This is normal discharge.  HOME CARE INSTRUCTIONS   Do not drive for 24 hours.   Avoid strenuous activities and exercises for at least 7 -   10 days.   Only take over-the-counter or prescription medicines for pain, discomfort, or fever as directed by your caregiver.   Do not take aspirin. It can cause or aggravate bleeding.   You may resume your usual diet.   Drink 6 to 8 glasses of fluid a day.   Rest and sleep the first 24 to 48 hours.   Take showers instead of baths until your caregiver gives you the okay.   Do not use tampons, douche or have intercourse for 4 weeks, or as advised by your caregiver.   Do not lift anything over 10 pounds (4.5 kg) for at least 7 to 10 days, or as advised by your caregiver.     Take your temperature twice a day, for 4 to 5 days. Write them down.   Do not drink or drive when taking pain medication.   If you develop constipation:   Take a mild laxative with the advice of your caregiver.   Eat bran foods.   Drink a lot of fluids.   Try to have someone with you or available for you the first 24 to 48 hours, especially if you had a general anesthetic.   Make sure you and your family understand everything about your surgery and recovery.   Follow your caregiver's advice regarding follow-up appointments and Pap smears.  SEEK MEDICAL CARE IF:   You develop a rash.   You are dizzy or light-headed.   You feel sick to your stomach (nauseous).   You develop a bad smelling vaginal discharge.   You have an abnormal reaction or allergy to your medication.   You need stronger pain medication.  SEEK IMMEDIATE MEDICAL CARE IF:   You have blood clots or bleeding that is heavier than a normal menstrual period, or you develop bright red bleeding.   An oral temperature above 102 F (38.9 C) develops and persists for several hours.   You have increasing cramps.   You pass out.   You have painful or bloody urine.   You start throwing up (vomiting).   The pain is not relieved with your pain medication.  Not all test results are available during your visit. If your test results are not back during your the visit, make an appointment with your caregiver to find out the results. Do not assume everything is normal if you have not heard from your caregiver or the medical facility. It is important for you to follow up on all of your test results. Document Released: 01/24/2005 Document Revised: 04/05/2011 Document Reviewed: 11/15/2008 Exit[employer] Patient Information 2012 [employer]. 

## 2011-10-19 NOTE — Transfer of Care (Signed)
Immediate Anesthesia Transfer of Care Note  Patient: Nicole Aguilar  Procedure(s) Performed: Procedure(s) (LRB): CONIZATION CERVIX WITH BIOPSY (N/A)  Patient Location: PACU  Anesthesia Type: General  Level of Consciousness: awake, alert  and oriented  Airway & Oxygen Therapy: Patient Spontanous Breathing and Patient connected to nasal cannula oxygen  Post-op Assessment: Report given to PACU RN and Post -op Vital signs reviewed and stable  Post vital signs: Reviewed and stable  Complications: No apparent anesthesia complications

## 2011-10-19 NOTE — Anesthesia Procedure Notes (Signed)
Procedure Name: LMA Insertion Date/Time: 10/19/2011 10:40 AM Performed by: Graciela Husbands Pre-anesthesia Checklist: Suction available, Timeout performed, Emergency Drugs available, Patient identified and Patient being monitored Patient Re-evaluated:Patient Re-evaluated prior to inductionOxygen Delivery Method: Circle system utilized Preoxygenation: Pre-oxygenation with 100% oxygen Intubation Type: IV induction LMA: LMA inserted LMA Size: 4.0 Number of attempts: 1 Placement Confirmation: positive ETCO2 and breath sounds checked- equal and bilateral Tube secured with: Tape Dental Injury: Teeth and Oropharynx as per pre-operative assessment

## 2011-10-19 NOTE — Op Note (Signed)
Pre-operative Diagnosis:  Endocervical dysplasia  Post-operative Diagnosis: same  Surgeon: Surgeon(s) and Role:    * Hal Morales, MD - Primary   Procedure and Anesthesia:  Procedure(s) and Anesthesia Type:    * CONIZATION CERVIX WITH BIOPSY - General  ASA Class: 2  Specimens to pathology: [facility] specimen     Endocervical curettings  Findings:  There was no  nonstaining area on the exocervix. The cervix sounded to 7 cm.  There were no exocervical lesions.  Procedure:  The patient was taken to the operating room after appropriate identification and placed on the operating table. After the attainment of adequate general anesthesia she was placed in the lithotomy position. The perineum and vagina were prepped with surgiprep and in and out catheterization undertaken .  The perineum was draped as a sterile field. A weighted speculum was placed in the posterior vagina. A paracervical block was achieved with a total of 10 cc of 2% Xylocaine and the 5 and 7:00 positions. The single-tooth tenaculum was placed on the anterior cervix outside the transition zone.  Sutures of 0 Vicryl were placed at the 3 and 9:00 positions on the cervix and tied down.  The cervix was injected with a dilute solution of Pitressin.  A total of 27 cc was used.  A cone shaped excisional biopsy of the cervix was undertaken including the endocervical canal, and marked at the 12:00 position with a suture. The excised specimen was removed from the operative field.  The endocervical canal space was then curetted for a minimal amount of tissue.  Sutures of 0 Vicryl were then placed at the 6 and 12:00 positions. Hemostasis was achieved once placement of a suture at the 9:00 position was completed.  A piece of Gelfoam was placed and the conization bed.  All instruments were removed from the vagina and the patient awakened from general anesthesia.  She was taken to the recovery room in satisfactory condition having tolerated the procedure  well.  Sponge and instrument counts were correct.

## 2011-10-19 NOTE — Anesthesia Preprocedure Evaluation (Signed)
Anesthesia Evaluation  Patient identified by MRN, date of birth, ID band Patient awake    Reviewed: Allergy & Precautions, H&P , NPO status , Patient's Chart, lab work & pertinent test results, reviewed documented beta blocker date and time   History of Anesthesia Complications Negative for: history of anesthetic complications  Airway Mallampati: II TM Distance: >3 FB Neck ROM: full    Dental  (+) Teeth Intact   Pulmonary neg pulmonary ROS,  breath sounds clear to auscultation  Pulmonary exam normal       Cardiovascular Exercise Tolerance: Good + dysrhythmias (a. flutter on holter.  no meds.  palpitations several times a week.  resolve spontaneously) Atrial Fibrillation Rhythm:regular Rate:Normal  EKG showed RBB.  Echo 10/16/2011 showed EF 63%, normal valves.  Cleared by cardiologist for surgery.   Neuro/Psych  Headaches (headaches - daily),  Neuromuscular disease (fibromyalgia) negative psych ROS   GI/Hepatic negative GI ROS, Neg liver ROS,   Endo/Other  negative endocrine ROS  Renal/GU negative Renal ROS Bladder dysfunction (interstitial cystitis)      Musculoskeletal   Abdominal   Peds  Hematology Lymphoma - completed chemo in 10/2010   Anesthesia Other Findings   Reproductive/Obstetrics negative OB ROS                           Anesthesia Physical Anesthesia Plan  ASA: II  Anesthesia Plan: General LMA   Post-op Pain Management:    Induction:   Airway Management Planned:   Additional Equipment:   Intra-op Plan:   Post-operative Plan:   Informed Consent: I have reviewed the patients History and Physical, chart, labs and discussed the procedure including the risks, benefits and alternatives for the proposed anesthesia with the patient or authorized representative who has indicated his/her understanding and acceptance.   Dental Advisory Given  Plan Discussed with: CRNA and  Surgeon  Anesthesia Plan Comments:         Anesthesia Quick Evaluation

## 2011-10-21 NOTE — Anesthesia Postprocedure Evaluation (Signed)
  Anesthesia Post-op Note  Patient: Nicole Aguilar  Procedure(s) Performed: Procedure(s) (LRB): CONIZATION CERVIX WITH BIOPSY (N/A) Patient is awake and responsive. Pain and nausea are reasonably well controlled. Vital signs are stable and clinically acceptable. Oxygen saturation is clinically acceptable. There are no apparent anesthetic complications at this time. Patient is ready for discharge.

## 2011-10-22 ENCOUNTER — Encounter (HOSPITAL_COMMUNITY): Payer: Self-pay | Admitting: Obstetrics and Gynecology

## 2011-10-24 DIAGNOSIS — R300 Dysuria: Secondary | ICD-10-CM | POA: Insufficient documentation

## 2011-10-24 DIAGNOSIS — R3915 Urgency of urination: Secondary | ICD-10-CM | POA: Insufficient documentation

## 2011-10-24 DIAGNOSIS — N9489 Other specified conditions associated with female genital organs and menstrual cycle: Secondary | ICD-10-CM

## 2011-10-24 DIAGNOSIS — K589 Irritable bowel syndrome without diarrhea: Secondary | ICD-10-CM | POA: Insufficient documentation

## 2011-10-24 DIAGNOSIS — R350 Frequency of micturition: Secondary | ICD-10-CM | POA: Insufficient documentation

## 2011-10-24 DIAGNOSIS — M6289 Other specified disorders of muscle: Secondary | ICD-10-CM

## 2011-10-24 DIAGNOSIS — R109 Unspecified abdominal pain: Secondary | ICD-10-CM

## 2011-10-24 DIAGNOSIS — IMO0002 Reserved for concepts with insufficient information to code with codable children: Secondary | ICD-10-CM | POA: Insufficient documentation

## 2011-10-24 HISTORY — DX: Irritable bowel syndrome, unspecified: K58.9

## 2011-10-24 HISTORY — DX: Other specified conditions associated with female genital organs and menstrual cycle: N94.89

## 2011-10-24 HISTORY — DX: Unspecified abdominal pain: R10.9

## 2011-10-24 HISTORY — DX: Dysuria: R30.0

## 2011-10-24 HISTORY — DX: Other specified disorders of muscle: M62.89

## 2011-11-05 ENCOUNTER — Encounter: Payer: Self-pay | Admitting: Obstetrics and Gynecology

## 2011-11-05 ENCOUNTER — Ambulatory Visit (INDEPENDENT_AMBULATORY_CARE_PROVIDER_SITE_OTHER): Admitting: Obstetrics and Gynecology

## 2011-11-05 VITALS — BP 122/62 | Temp 98.2°F | Ht 61.0 in | Wt 148.0 lb

## 2011-11-05 DIAGNOSIS — Z79890 Hormone replacement therapy (postmenopausal): Secondary | ICD-10-CM

## 2011-11-05 MED ORDER — ESTRADIOL-NORETHINDRONE ACET 0.05-0.14 MG/DAY TD PTTW: 1.0000 | MEDICATED_PATCH | TRANSDERMAL | Status: DC

## 2011-11-05 NOTE — Patient Instructions (Signed)
Hormone Therapy At menopause, your body begins making less estrogen and progesterone hormones. This causes the body to stop having menstrual periods. This is because estrogen and progesterone hormones control your periods and menstrual cycle. A lack of estrogen may cause symptoms such as:  Hot flushes (or hot flashes).   Vaginal dryness.   Dry skin.   Loss of sex drive.   Risk of bone loss (osteoporosis).  When this happens, you may choose to take hormone therapy to get back the estrogen lost during menopause. When the hormone estrogen is given alone, it is usually referred to as ET (Estrogen Therapy). When the hormone progestin is combined with estrogen, it is generally called HT (Hormone Therapy). This was formerly known as hormone replacement therapy (HRT). Your caregiver can help you make a decision on what will be best for you. The decision to use HT seems to change often as new studies are done. Many studies do not agree on the benefits of hormone replacement therapy. LIKELY BENEFITS OF HT INCLUDE PROTECTION FROM:  Hot Flushes (also called hot flashes) - A hot flush is a sudden feeling of heat that spreads over the face and body. The skin may redden like a blush. It is connected with sweats and sleep disturbance. Women going through menopause may have hot flushes a few times a month or several times per day depending on the woman.   Osteoporosis (bone loss)- Estrogen helps guard against bone loss. After menopause, a woman's bones slowly lose calcium and become weak and brittle. As a result, bones are more likely to break. The hip, wrist, and spine are affected most often. Hormone therapy can help slow bone loss after menopause. Weight bearing exercise and taking calcium with vitamin D also can help prevent bone loss. There are also medications that your caregiver can prescribe that can help prevent osteoporosis.   Vaginal Dryness - Loss of estrogen causes changes in the vagina. Its lining  may become thin and dry. These changes can cause pain and bleeding during sexual intercourse. Dryness can also lead to infections. This can cause burning and itching. (Vaginal estrogen treatment can help relieve pain, itching, and dryness.)   Urinary Tract Infections are more common after menopause because of lack of estrogen. Some women also develop urinary incontinence because of low estrogen levels in the vagina and bladder.   Possible other benefits of estrogen include a positive effect on mood and short-term memory in women.  RISKS AND COMPLICATIONS  Using estrogen alone without progesterone causes the lining of the uterus to grow. This increases the risk of lining of the uterus (endometrial) cancer. Your caregiver should give another hormone called progestin if you have a uterus.   Women who take combined (estrogen and progestin) HT appear to have an increased risk of breast cancer. The risk appears to be small, but increases throughout the time that HT is taken.   Combined therapy also makes the breast tissue slightly denser which makes it harder to read mammograms (breast X-rays).   Combined, estrogen and progesterone therapy can be taken together every day, in which case there may be spotting of blood. HT therapy can be taken cyclically in which case you will have menstrual periods. Cyclically means HT is taken for a set amount of days, then not taken, then this process is repeated.   HT may increase the risk of stroke, heart attack, breast cancer and forming blood clots in your leg.   Transdermal estrogen (estrogen that is absorbed   through the skin with a patch or a cream) may have more positive results with:   Cholesterol.   Blood pressure.   Blood clots.  Having the following conditions may indicate you should not have HT:  Endometrial cancer.   Liver disease.   Breast cancer.   Heart disease.   History of blood clots.   Stroke.  TREATMENT   If you choose to take HT  and have a uterus, usually estrogen and progestin are prescribed.   Your caregiver will help you decide the best way to take the medications.   Possible ways to take estrogen include:   Pills.   Patches.   Gels.   Sprays.   Vaginal estrogen cream, rings and tablets.   It is best to take the lowest dose possible that will help your symptoms and take them for the shortest period of time that you can.   Hormone therapy can help relieve some of the problems (symptoms) that affect women at menopause. Before making a decision about HT, talk to your caregiver about what is best for you. Be well informed and comfortable with your decisions.  HOME CARE INSTRUCTIONS   Follow your caregivers advice when taking the medications.   A Pap test is done to screen for cervical cancer.   The first Pap test should be done at age 21.   Between ages 21 and 29, Pap tests are repeated every 2 years.   Beginning at age 30, you are advised to have a Pap test every 3 years as long as your past 3 Pap tests have been normal.   Some women have medical problems that increase the chance of getting cervical cancer. Talk to your caregiver about these problems. It is especially important to talk to your caregiver if a new problem develops soon after your last Pap test. In these cases, your caregiver may recommend more frequent screening and Pap tests.   The above recommendations are the same for women who have or have not gotten the vaccine for HPV (Human Papillomavirus).   If you had a hysterectomy for a problem that was not a cancer or a condition that could lead to cancer, then you no longer need Pap tests. However, even if you no longer need a Pap test, a regular exam is a good idea to make sure no other problems are starting.    If you are between ages 65 and 70, and you have had normal Pap tests going back 10 years, you no longer need Pap tests. However, even if you no longer need a Pap test, a regular  exam is a good idea to make sure no other problems are starting.    If you have had past treatment for cervical cancer or a condition that could lead to cancer, you need Pap tests and screening for cancer for at least 20 years after your treatment.   If Pap tests have been discontinued, risk factors (such as a new sexual partner) need to be re-assessed to determine if screening should be resumed.   Some women may need screenings more often if they are at high risk for cervical cancer.   Get mammograms done as per the advice of your caregiver.  SEEK IMMEDIATE MEDICAL CARE IF:  You develop abnormal vaginal bleeding.   You have pain or swelling in your legs, shortness of breath, or chest pain.   You develop dizziness or headaches.   You have lumps or changes in your breasts or   armpits.   You have slurred speech.   You develop weakness or numbness of your arms or legs.   You have pain, burning, or bleeding when urinating.   You develop abdominal pain.  Document Released: 01/13/2003 Document Revised: 04/05/2011 Document Reviewed: 05/03/2010 [facility] Patient Information 2012 [place], [place].Hormonal Therapy for Women, Frequently Asked Questions WHAT IS HORMONE THERAPY? Hormone therapy (HT), estrogen and progesterone, provides women with the female hormones that decrease and are lost as women get older. When the hormone estrogen is given alone, it is usually referred to as "ERT." When the hormone progesterone is combined with estrogen, it is generally called "HT." Previously this was known as hormone replacement therapy (HRT). Estrogen is a female hormone that brings about changes in various organs in the body. Progesterone is a female hormone that prepares the uterus for a pregnancy each month. During the change-over to menopause ("perimenopause") these hormone levels start to decrease. This causes many uncomfortable symptoms (see below). When the ovaries stop producing estrogen and  progesterone, menstrual periods come to an end. At this point, the woman has experienced menopause. Menopause is complete when a woman misses 12 consecutive menstrual periods. WHAT ARE THE BENEFITS OF HORMONE THERAPY? Hormone therapy has been used to relieve the short-term symptoms of menopause. These include:  Hot flashes.   Depression.   Memory loss.   Correcting irregular menstrual periods.   Night sweats.   Tiredness.   Mood disturbances.   Thinning of scalp hair.   Disturbed sleep.   Vaginal dryness.   Painful intercourse.   Loss of breast tissue.  Evidence shows that HT may be helpful in preventing colon cancer and bone loss (osteoporosis). WHAT ARE THE SHORT-TERM RISKS OF HORMONE THERAPY?  Some women report side effects from taking Hormone Therapy, including:   Feeling sick to stomach (nausea).   Fluid retention.   Swollen breasts.   Acne, when taking HT with progesterone.   Unusual vaginal discharge and bleeding (if the uterus is present).   Headaches.   Some women think HT will make them gain weight. Research now shows this is not true. Some women do gain weight during menopause, but this is because their metabolism slows down as they age. They also may not be increasing their amount or level of physical activity as they get older.   Short-term benefits or side effects should become noticeable within days, weeks, or sometimes months after treatment begins.  LONG-TERM RISKS These will not be easily noticeable for each individual woman. There are many factors involved that can contribute to long-term risks and side effects. CANCER There is concern that HT can increase the risk of some cancers, including endometrial cancer (lining of the uterus), breast, and certain (but not all) ovarian or cervix cancers, such as endometriod ovarian cancer.  When estrogen is taken alone, it raises the risk of endometrial cancer, if the uterus is still present. Adding  progestin with estrogen (HT) can greatly reduce this risk. Progestin is added to prevent the overgrowth (hyperplasia) of cells in the uterine lining. Women who still have an intact uterus are generally given this combined therapy and should not take estrogen hormone alone without progesterone. HT with estrogen and progestin has been linked to an increased risk of invasive breast cancer. Women who use estrogen plus progestin for four years or longer are more likely to develop breast cancer than women who have not used them for as long. This indicates that the therapy may have a cumulative effect. The  decision to take HT should be based on an overall look at the risk and benefits, and how they fit with your personal and genetic health profile. Conditions that increase the underlying risk of developing breast cancer include:  Family history of breast cancer.   Early age of the first menstrual period (menarche).   Late age of child bearing.   High fat diet.   Late menopause.   Obesity.   Increased breast density on mammograms.   Certain non-cancerous (benign) breast lesions.   Excessive use of alcohol.   Extensive radiation exposure to the chest.  These factors need to be considered when deciding to take HT. If you are currently taking HT and have concerns, talk with your caregiver as soon as possible.  BREAST DENSITY Taking both estrogen and progestin also can affect a woman's breast density. Increased breast density from HT makes it hard for a radiologist to read some special breast x-rays (mammograms). This leads to the need for follow-up mammograms, ultrasound or MRI (magnetic resonance imaging), or taking breast tissue samples that are surgically removed (biopsies). Increased density also is a concern because studies have shown that women age 76 and older, whose mammograms show at least 75 percent dense tissue, are at increased risk for breast cancer. However, it is not known if increased  breast density due to HT carries the same risk for breast cancer as having naturally dense breasts. About 25 percent of women who use combined HT have an increase in breast density on their mammograms. This is compared to about 8 percent of women taking estrogen alone. One study showed that stopping HT for about 2 weeks before having a mammogram improved the readability of the mammogram. But further research is needed to confirm the usefulness of this approach. HEART DISEASE In the past, taking HT (estrogen plus progestin) was thought to help protect women against heart disease. However, recent findings show that taking HT poses more risks than benefits. HT could increase a woman's risk for:  Heart disease.   Stroke.   Blood clot in the lung (pulmonary embolism).   Breast cancer.   Blood clots in the legs.  Women who have gone through menopause should not be given HT to prevent heart disease and other chronic conditions.  Women who have gone through menopause and who have heart disease, may have a greater risk of another cardiac event (like heart attack) after starting HT, at least in the short-term. For women who have had strokes, their risk for having another stroke goes up when they start taking HT. Hormones are not recommended for women with heart disease or for women who have had a stroke. If you have gone through menopause, talk with your caregiver about whether hormones are right for you. You can check the [facility] website (http://hoffman.com/) for updates on postmenopausal hormone therapy. OTHER RISKS INCLUDE:  Developing high blood pressure.   Developing gallbladder disease.   Women with a fibroid non-cancerous tumor on the uterus may develop pain, bleeding or increase growth of the fibroid.  If you are taking HT, watch for signs of trouble. These include:  Abnormal bleeding.   Breast lumps, bloody discharge or red/painful breasts.   Shortness  of breath.   Dizziness.   Abdominal pain.   Severe headaches.   Pain in your calves or chest.  Report these signs to your caregiver right away. Also, talk with your caregiver about how often you should have an exam. DOES THE  DURATION OF TAKING HT AFFECT BREAST CANCER RISK? The relationship between a woman's risk of developing breast cancer and the length of time that she receives HT is not clear. Some women take HT for only a few years until the worst of their menopausal symptoms have passed. Others have taken it for 10 years or more. Some researchers believe that there is little or no increased risk of breast cancer associated with short-term use of either HT with estrogen alone or estrogen combined with progestin. But long-term use is linked to an increased risk. Women on HT should continue to do monthly self breast exams and get their mammograms as recommended by their caregiver. WHY IS MENOPAUSAL HORMONE THERAPY USED IN SPITE OF THE CANCER RISK? The known benefits of HT can improve the quality of life for many women, by reducing uncomfortable symptoms, as mentioned above. There also is evidence that HT helps prevent and treats osteoporosis. There is preliminary evidence that it can help prevent other problems associated with age, including colon cancer. The addition of progestin to the treatment has greatly reduced the risk of uterine cancer. ARE THERE OTHER DRUG THERAPIES KNOWN TO TREAT CONDITIONS RELATED TO MENOPAUSE? A class of antidepressant drugs called Selective Serotonin Reuptake Inhibitors (SSRIs) are effective in treating menopause-related symptoms of depression or mood changes. Vitamin E and Clonidine (drug typically used for high blood pressure) can help reduce hot flashes. To prevent osteoporosis, women who are at high risk for bone loss may be given drugs such as bisphosphonates, alendronate, raloxifene, calcium with vitamin D, calcitonin, and prescription medicines such as fasomax or  boneva. Lastly, a class of cholesterol-lowering drugs called HMG-CoA-reductase inhibitors (statins) are proven to be effective for reducing risk of heart disease. They are also being explored to prevent osteoporosis. No alternatives to estrogen exist for prevention of colon cancer - a disease for which early evidence suggests HT may be beneficial. WHO SHOULD NOT USE HT?  HT is often not recommended for women who have any of the following conditions:  Vaginal bleeding of an unknown cause.   Suspected breast cancer or history of breast cancer.   History of endometrial or uterine cancer.   Chronic disease of the liver.   History of heart disease.   History of blood clots in the veins or legs or in the lung (venous thrombosis). This includes women who have had thrombosis or blood clots during pregnancy or when taking birth control pills. Although the risk of blood clots in women is very low, HT increases the risk.   Severe or uncontrolled high blood pressure.   Anyone who may be pregnant.  HOW CAN I SORT THROUGH THE BENEFITS AND RISKS TO MAKE A GOOD DECISION ABOUT WHETHER OR NOT TO USE POSTMENOPAUSAL HORMONE THERAPY? Here are several helpful points, summarizing the findings of the [facility]) study:  First, it is important to know that because the study involved healthy women, only a small number of them had either a negative or positive effect from estrogen plus progestin therapy. The percentages describe what would happen to a whole population, not necessarily to any individual woman. Second, remember that percentages are not fate. Whether expressing risks or benefits, a percentage does not mean you will develop a disease. Many factors affect that likelihood, including:  Your lifestyle.   Environmental factors.   Heredity.   Your personal medical history.  Realize that most treatments carry risks and benefits. No one can make a treatment choice for you. Talk  with your  caregiver and decide what is best for your health and quality of life. Begin by finding out your family history and your personal risk profile for:  Heart disease.   Stroke.   Breast cancer.   Osteoporosis.   Colorectal cancer.   Blood clots.   Other medical conditions.  Document Released: 01/13/2003 Document Revised: 04/05/2011 Document Reviewed: 02/14/2009 [facility] Patient Information 2012 [place], [place].

## 2011-11-05 NOTE — Progress Notes (Signed)
Surgery followup: DATE OF SURGERY: 10/19/2011 TYPE OF SURGERY: [facility]C PAIN:Yes Lower Back Cramping VAG BLEEDING: yes VAG DISCHARGE: yes NORMAL GI FUNCTN: no cramping NORMAL GU FUNCTN: no  PATHOLOGY REPORT 1. Cervix, cone - BENIGN CERVICAL MUCOSA, SEE COMMENT. - NEGATIVE FOR INTRAEPITHELIAL LESION OR MALIGNANCY. - ECTOCERVICAL AND ENDOCERVICAL MARGINS, NEGATIVE FOR DYSPLASIA OR MALIGNANCY. 2. Endocervix, curettage - NO ENDOCERVICAL TISSUE PRESENT, SEE COMMENT. Microscopic Comment 1. The history of cervical dysplasia and the recent cervical biopsy demonstrated benign findings ([id number]) are noted. In the current LEEP, there is no squamous intraepithelial lesion identified within numerous recut tissue sections. In addition, there is no aberrant expression of p16 immunostain. There is no previous Pap smear for comparison. (CRR:eps 10/24/11) 2. There was only blood-tinged mucus present in the specimen container formalin solution. The formalin solution was filtered and a cell block made. No endocervical tissue was present in the cell block. (CRR:eps 10/24/11) [place] RUND DO C/o hot flashes unrelieved by Climara pro. The hot flashes are also associated with headaches which the patient had had prior to menopause as well.  BP 122/62  Temp 98.2 F (36.8 C) (Oral)  Ht 5\' 1"  (1.549 m)  Wt 148 lb (67.132 kg)  BMI 27.96 kg/m2  LMP 02/28/2010  Pelvic exam: VULVA: normal appearing vulva with no masses, tenderness or lesions, VAGINA: normal appearing vagina with normal color and discharge, no lesions, vaginal discharge - bloody, CERVIX: , healing well, UTERUS: uterus is normal size, shape, consistency and nontender, ADNEXA: normal adnexa in size, nontender and no masses.  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  No evidence of vaginal lesions that may be source of dysplasia.  History of normal endometrial  biopsies Vasomotor symptoms not relieved by Climara pro Probable hormonal headache Continued light vaginal bleeding 2 weeks status post cold knife conization without evidence of infection  Recommendation: Abstain from vaginal intercourse until there has been no bleeding for one week Discontinue Climara pro Begin CombiPatch 0.05/ 0.14  twice weekly  followup in 6 weeks Next Pap smear due in 6 months

## 2011-12-27 ENCOUNTER — Encounter: Admitting: Obstetrics and Gynecology

## 2012-01-03 ENCOUNTER — Other Ambulatory Visit: Payer: Self-pay | Admitting: Obstetrics and Gynecology

## 2012-01-03 DIAGNOSIS — Z1231 Encounter for screening mammogram for malignant neoplasm of breast: Secondary | ICD-10-CM

## 2012-01-07 ENCOUNTER — Ambulatory Visit
Admission: RE | Admit: 2012-01-07 | Discharge: 2012-01-07 | Disposition: A | Discharge status: Home / Self Care | Source: Ambulatory Visit | Attending: Obstetrics and Gynecology | Admitting: Obstetrics and Gynecology

## 2012-01-07 DIAGNOSIS — Z1231 Encounter for screening mammogram for malignant neoplasm of breast: Secondary | ICD-10-CM

## 2013-02-11 DIAGNOSIS — J329 Chronic sinusitis, unspecified: Secondary | ICD-10-CM

## 2013-02-11 DIAGNOSIS — N760 Acute vaginitis: Secondary | ICD-10-CM

## 2013-02-11 HISTORY — DX: Chronic sinusitis, unspecified: J32.9

## 2013-02-11 HISTORY — DX: Acute vaginitis: N76.0

## 2013-07-14 ENCOUNTER — Other Ambulatory Visit: Payer: Self-pay

## 2013-07-14 ENCOUNTER — Ambulatory Visit
Admission: RE | Admit: 2013-07-14 | Discharge: 2013-07-14 | Disposition: A | Discharge status: Home / Self Care | Source: Ambulatory Visit

## 2013-07-14 DIAGNOSIS — Z1231 Encounter for screening mammogram for malignant neoplasm of breast: Secondary | ICD-10-CM

## 2013-07-15 ENCOUNTER — Other Ambulatory Visit: Payer: Self-pay | Admitting: Obstetrics and Gynecology

## 2013-07-15 DIAGNOSIS — R928 Other abnormal and inconclusive findings on diagnostic imaging of breast: Secondary | ICD-10-CM

## 2013-07-28 ENCOUNTER — Other Ambulatory Visit

## 2013-07-28 ENCOUNTER — Encounter

## 2013-07-29 ENCOUNTER — Ambulatory Visit
Admission: RE | Admit: 2013-07-29 | Discharge: 2013-07-29 | Disposition: A | Discharge status: Home / Self Care | Source: Ambulatory Visit | Attending: Obstetrics and Gynecology | Admitting: Obstetrics and Gynecology

## 2013-07-29 DIAGNOSIS — R928 Other abnormal and inconclusive findings on diagnostic imaging of breast: Secondary | ICD-10-CM

## 2014-01-12 DIAGNOSIS — R928 Other abnormal and inconclusive findings on diagnostic imaging of breast: Secondary | ICD-10-CM | POA: Insufficient documentation

## 2014-03-01 ENCOUNTER — Encounter: Payer: Self-pay | Admitting: Obstetrics and Gynecology

## 2014-10-28 DIAGNOSIS — Z95828 Presence of other vascular implants and grafts: Secondary | ICD-10-CM | POA: Insufficient documentation

## 2014-10-28 DIAGNOSIS — T82898A Other specified complication of vascular prosthetic devices, implants and grafts, initial encounter: Secondary | ICD-10-CM | POA: Insufficient documentation

## 2015-03-04 DIAGNOSIS — M5481 Occipital neuralgia: Secondary | ICD-10-CM

## 2015-03-04 DIAGNOSIS — M7918 Myalgia, other site: Secondary | ICD-10-CM | POA: Insufficient documentation

## 2015-03-04 DIAGNOSIS — M461 Sacroiliitis, not elsewhere classified: Secondary | ICD-10-CM | POA: Insufficient documentation

## 2015-03-04 HISTORY — DX: Myalgia, other site: M79.18

## 2015-03-04 HISTORY — DX: Sacroiliitis, not elsewhere classified: M46.1

## 2015-03-04 HISTORY — DX: Occipital neuralgia: M54.81

## 2015-12-23 DIAGNOSIS — R112 Nausea with vomiting, unspecified: Secondary | ICD-10-CM | POA: Insufficient documentation

## 2015-12-23 DIAGNOSIS — K529 Noninfective gastroenteritis and colitis, unspecified: Secondary | ICD-10-CM | POA: Insufficient documentation

## 2015-12-23 DIAGNOSIS — R6881 Early satiety: Secondary | ICD-10-CM

## 2015-12-23 HISTORY — DX: Noninfective gastroenteritis and colitis, unspecified: K52.9

## 2015-12-23 HISTORY — DX: Early satiety: R68.81

## 2015-12-23 HISTORY — DX: Nausea with vomiting, unspecified: R11.2

## 2016-06-27 DIAGNOSIS — IMO0002 Reserved for concepts with insufficient information to code with codable children: Secondary | ICD-10-CM | POA: Insufficient documentation

## 2016-12-13 ENCOUNTER — Ambulatory Visit (INDEPENDENT_AMBULATORY_CARE_PROVIDER_SITE_OTHER)

## 2016-12-13 ENCOUNTER — Other Ambulatory Visit: Payer: Self-pay | Admitting: Podiatry

## 2016-12-13 ENCOUNTER — Ambulatory Visit (INDEPENDENT_AMBULATORY_CARE_PROVIDER_SITE_OTHER): Admitting: Podiatry

## 2016-12-13 ENCOUNTER — Encounter: Payer: Self-pay | Admitting: Podiatry

## 2016-12-13 VITALS — BP 136/86 | HR 69

## 2016-12-13 DIAGNOSIS — M6740 Ganglion, unspecified site: Secondary | ICD-10-CM

## 2016-12-13 DIAGNOSIS — M79672 Pain in left foot: Secondary | ICD-10-CM | POA: Diagnosis not present

## 2016-12-13 NOTE — Progress Notes (Signed)
   Subjective:    Patient ID: Nicole Aguilar, female    DOB: Apr 19, 1961, 56 y.o.   MRN: [id number]  HPI  Chief Complaint  Patient presents with  . Foot Pain    Lt foot/ swelling top of foot below toes, toes throbbing       Review of Systems  Constitutional: Positive for fatigue.  HENT: Positive for congestion.   Cardiovascular:       Calf pain with walking  Gastrointestinal: Positive for constipation, diarrhea, nausea and vomiting.  Endocrine: Positive for cold intolerance and heat intolerance.  Genitourinary: Positive for frequency and urgency.  Musculoskeletal: Positive for arthralgias, back pain and myalgias.  Allergic/Immunologic: Positive for food allergies.  Neurological: Positive for dizziness, weakness, light-headedness and headaches.  Hematological: Bruises/bleeds easily.  All other systems reviewed and are negative.      Objective:   Physical Exam        Assessment & Plan:

## 2016-12-14 LAB — CYTOLOGY, FLUID (SOLSTAS)

## 2016-12-14 NOTE — Progress Notes (Signed)
Subjective:    Patient ID: Nicole Aguilar, female   DOB: 56 y.o.   MRN: [id number]   HPI patient presents stating she's developed edema and a area of enlargement around the second interspace and does not remember injury    Review of Systems  All other systems reviewed and are negative.       Objective:  Physical Exam  Constitutional: She appears well-developed and well-nourished.  Cardiovascular: Intact distal pulses.   Musculoskeletal: Normal range of motion.  Neurological: She is alert.  Skin: Skin is warm.  Nursing note and vitals reviewed.  Neurovascular status intact muscle strength was adequate range of motion within normal limits with patient noted to have 2 month history of mass in the second interspace left that measures approximately 1 cm x 0.5 cm and appears to be movable within subcutaneous tissue. It is mildly tender when pressed and does bother her with shoe gear     Assessment:   Probability for ganglionic cyst formation left versus congealed hematoma or other soft tissue mass      Plan:    H&P and x-ray reviewed. Today I did a proximal nerve block of this area I then aspirated it getting out approximately is 2 mL of gelatinous fluid and I applied a small amount steroid with sterile dressing. This was sent off for pathological evaluation and we will get those results and discuss with her  X-rays were negative for signs that there is a fracture negative for signs of there is any kind of bone injury

## 2017-01-03 ENCOUNTER — Ambulatory Visit: Admitting: Podiatry

## 2017-01-08 ENCOUNTER — Ambulatory Visit (INDEPENDENT_AMBULATORY_CARE_PROVIDER_SITE_OTHER): Admitting: Podiatry

## 2017-01-08 DIAGNOSIS — M84375A Stress fracture, left foot, initial encounter for fracture: Secondary | ICD-10-CM | POA: Diagnosis not present

## 2017-01-08 DIAGNOSIS — M6740 Ganglion, unspecified site: Secondary | ICD-10-CM

## 2017-01-14 NOTE — Progress Notes (Signed)
   Subjective:    Patient ID: Nicole Aguilar, female    DOB: 1961/02/14, 56 y.o.   MRN: [id number]  HPI Chief Complaint  Patient presents with  . Ganglion Cyst    fu ganglion cyst seems to be gone   . Foot Pain    still having a lot of pain   feels like being stabbed when pushing off   . Toe Pain    still having pain in the left 4th toe     56 y.o. female returns for the above complaint. Had cyst aspirated at last visit, reports that she is still having pain in her L foot. States that she thought the pain was from the cyst but now with that gone she feels like the pain is more localized a little further back on her foot. Still having pain in her L 4th toe. States the cyst has not recurred.  Review of Systems     Objective:   Physical Exam  There were no vitals filed for this visit. General AA&O x3. Normal mood and affect.  Vascular Dorsalis pedis and posterior tibial pulses  present 2+ bilaterally  Capillary refill normal to all digits. Pedal hair growth normal.  Neurologic Epicritic sensation grossly present.  Dermatologic No open lesions. Interspaces clear of maceration. Nails well groomed and normal in appearance.  Orthopedic: MMT 5/5 in dorsiflexion, plantarflexion, inversion, and eversion. Normal joint ROM without pain or crepitus. Pain on palpation L 4th metatarsal area with + tuning fork.     Fluid Cytology 8/16: Cytology, Fluid  Order: [id number]  Status:  Final result  Dx:  Ganglion cyst  Component 67mo ago  FINAL DIAGNOSIS: SEE NOTE   Comment: -  NO MALIGNANT CELLS IDENTIFIED.  Rare mononuclear cells/histiocytes in a background of mucoid/myxoid  material suggestive of ganglion cyst, clinical correlation required         Assessment & Plan:  Patient was evaluated and treated with all questions answered.  Ganglion Cyst, L Foot -Cytology results reviewed with patient -Cyst has not recurred as of yet. Discussed with patient possible recurrence.  Stress  Fracture L 4th Metatarsal -+Tuning fork test, pain s/o possible stress fx. -Will immobilize in a CAM boot for 4 weeks. Medically necessary for immobilization and protection.  Return in about 4 weeks (around 02/05/2017).

## 2017-02-05 ENCOUNTER — Ambulatory Visit (INDEPENDENT_AMBULATORY_CARE_PROVIDER_SITE_OTHER): Admitting: Podiatry

## 2017-02-05 ENCOUNTER — Ambulatory Visit (INDEPENDENT_AMBULATORY_CARE_PROVIDER_SITE_OTHER)

## 2017-02-05 DIAGNOSIS — M84375D Stress fracture, left foot, subsequent encounter for fracture with routine healing: Secondary | ICD-10-CM

## 2017-02-05 DIAGNOSIS — G5762 Lesion of plantar nerve, left lower limb: Secondary | ICD-10-CM | POA: Diagnosis not present

## 2017-02-11 NOTE — Progress Notes (Signed)
  Subjective:  Patient ID: Nicole Aguilar, female    DOB: 09/18/1960,  MRN: [id number]  Chief Complaint  Patient presents with  . Foot Pain    4wk fu cyst and possible stress fracture    56 y.o. female returns for the above complaint. States that she has been feeling much better than previous but she still has some residual pain in the left foot. At this time it more near her toes.  Objective:  There were no vitals filed for this visit. General AA&O x3. Normal mood and affect.  Vascular Dorsalis pedis and posterior tibial pulses  present 1+ bilaterally  Capillary refill normal to all digits. Pedal hair growth normal.  Neurologic Epicritic sensation grossly present.  Dermatologic No open lesions. Interspaces clear of maceration. Nails well groomed and normal in appearance. Negative Tinel sign left third interspace   Orthopedic: MMT 5/5 in dorsiflexion, plantarflexion, inversion, and eversion. Normal joint ROM without pain or crepitus. Mulder's click L 3rd interspace.   Assessment & Plan:  Patient was evaluated and treated and all questions answered.  Stress Fracture, L -Improving. D/C CAM boot to patient tolerance.  Morton's Neuroma, L -Injection delivered as below.  Procedure: Neuroma Injection Location: Left 3rd interspace Skin Prep: Alcohol. Injectate: 0.5 cc 0.5% marcaine plain, 0.5 cc dexamethasone phosphate. Disposition: Patient tolerated procedure well. Injection site dressed with a band-aid.

## 2017-03-05 ENCOUNTER — Ambulatory Visit: Admitting: Podiatry

## 2017-12-05 DIAGNOSIS — H538 Other visual disturbances: Secondary | ICD-10-CM | POA: Insufficient documentation

## 2017-12-05 DIAGNOSIS — R413 Other amnesia: Secondary | ICD-10-CM | POA: Insufficient documentation

## 2020-02-02 ENCOUNTER — Other Ambulatory Visit: Payer: Self-pay | Admitting: Obstetrics and Gynecology

## 2020-03-01 ENCOUNTER — Encounter: Payer: Self-pay | Admitting: Obstetrics and Gynecology

## 2020-07-22 DIAGNOSIS — R87619 Unspecified abnormal cytological findings in specimens from cervix uteri: Secondary | ICD-10-CM | POA: Insufficient documentation

## 2020-07-22 DIAGNOSIS — F5231 Female orgasmic disorder: Secondary | ICD-10-CM | POA: Insufficient documentation

## 2020-07-22 DIAGNOSIS — K219 Gastro-esophageal reflux disease without esophagitis: Secondary | ICD-10-CM | POA: Insufficient documentation

## 2020-07-22 DIAGNOSIS — B009 Herpesviral infection, unspecified: Secondary | ICD-10-CM | POA: Insufficient documentation

## 2020-07-22 DIAGNOSIS — IMO0002 Reserved for concepts with insufficient information to code with codable children: Secondary | ICD-10-CM | POA: Insufficient documentation

## 2020-07-22 HISTORY — DX: Herpesviral infection, unspecified: B00.9

## 2020-07-22 HISTORY — DX: Unspecified abnormal cytological findings in specimens from cervix uteri: R87.619

## 2020-07-22 HISTORY — DX: Gastro-esophageal reflux disease without esophagitis: K21.9

## 2020-08-30 ENCOUNTER — Other Ambulatory Visit: Payer: Self-pay

## 2020-08-30 ENCOUNTER — Other Ambulatory Visit: Payer: Self-pay | Admitting: Chiropractic Medicine

## 2020-08-30 ENCOUNTER — Ambulatory Visit
Admission: RE | Admit: 2020-08-30 | Discharge: 2020-08-30 | Disposition: A | Discharge status: Home / Self Care | Source: Ambulatory Visit | Attending: Chiropractic Medicine | Admitting: Chiropractic Medicine

## 2020-08-30 DIAGNOSIS — M545 Low back pain, unspecified: Secondary | ICD-10-CM

## 2020-11-25 ENCOUNTER — Ambulatory Visit (INDEPENDENT_AMBULATORY_CARE_PROVIDER_SITE_OTHER): Admitting: Podiatry

## 2020-11-25 ENCOUNTER — Encounter: Payer: Self-pay | Admitting: Podiatry

## 2020-11-25 ENCOUNTER — Other Ambulatory Visit: Payer: Self-pay

## 2020-11-25 ENCOUNTER — Ambulatory Visit (INDEPENDENT_AMBULATORY_CARE_PROVIDER_SITE_OTHER)

## 2020-11-25 DIAGNOSIS — C8290 Follicular lymphoma, unspecified, unspecified site: Secondary | ICD-10-CM

## 2020-11-25 DIAGNOSIS — M6740 Ganglion, unspecified site: Secondary | ICD-10-CM | POA: Diagnosis not present

## 2020-11-25 DIAGNOSIS — M79672 Pain in left foot: Secondary | ICD-10-CM

## 2020-11-25 HISTORY — DX: Follicular lymphoma, unspecified, unspecified site: C82.90

## 2020-11-25 NOTE — Progress Notes (Signed)
  Subjective:  Patient ID: Nicole Aguilar, female    DOB: Oct 08, 1960,  MRN: [id number]  Chief Complaint  Patient presents with   Ganglion Cyst    Left foot dorsal aspect ganglion cyst, pt states history of aspiration but it has returned. Pt states it is painful. 4 year duration.   60 y.o. female presents with the above complaint. History confirmed with patient.   Objective:  Physical Exam: warm, good capillary refill, no trophic changes or ulcerative lesions, normal DP and PT pulses, and normal sensory exam. Left Foot: POP left lateral foot with prominent cyst between tendons of 4th and 5th extensor slips. No warmth, erythema    No images are attached to the encounter.  Radiographs: X-ray of the left foot: no fracture, dislocation, swelling or degenerative changes noted Assessment:   1. Ganglion cyst   2. Pain in left foot      Plan:  Patient was evaluated and treated and all questions answered.  Ganglion cyst left -XR reviewed with patient. -Patient elects for removal. Avoid aspiration today. We did discuss the risk of recurrence. Patient would like to proceed. -Patient has failed all conservative therapy and wishes to proceed with surgical intervention. All risks, benefits, and alternatives discussed with patient. No guarantees given. Consent reviewed and signed by patient.    No follow-ups on file.

## 2021-01-09 ENCOUNTER — Other Ambulatory Visit: Payer: Self-pay | Admitting: Chiropractic Medicine

## 2021-01-09 DIAGNOSIS — M545 Low back pain, unspecified: Secondary | ICD-10-CM

## 2021-01-09 DIAGNOSIS — M5418 Radiculopathy, sacral and sacrococcygeal region: Secondary | ICD-10-CM

## 2021-01-11 ENCOUNTER — Encounter: Payer: Self-pay | Admitting: Podiatry

## 2021-01-11 ENCOUNTER — Other Ambulatory Visit: Payer: Self-pay | Admitting: Podiatry

## 2021-01-11 DIAGNOSIS — M67472 Ganglion, left ankle and foot: Secondary | ICD-10-CM | POA: Diagnosis not present

## 2021-01-11 MED ORDER — ONDANSETRON HCL 4 MG PO TABS: 4.0000 mg | ORAL_TABLET | Freq: Three times a day (TID) | ORAL | 0 refills | Status: DC | PRN

## 2021-01-11 MED ORDER — OXYCODONE-ACETAMINOPHEN 5-325 MG PO TABS: 1.0000 | ORAL_TABLET | ORAL | 0 refills | Status: DC | PRN

## 2021-01-11 MED ORDER — CEPHALEXIN 500 MG PO CAPS: ORAL_CAPSULE | ORAL | 0 refills | Status: DC

## 2021-01-11 NOTE — Progress Notes (Signed)
Rx sent to pharmacy for outpatient surgery. °

## 2021-01-17 ENCOUNTER — Other Ambulatory Visit: Payer: Self-pay

## 2021-01-17 ENCOUNTER — Encounter: Payer: Self-pay | Admitting: Podiatry

## 2021-01-17 ENCOUNTER — Ambulatory Visit (INDEPENDENT_AMBULATORY_CARE_PROVIDER_SITE_OTHER): Admitting: Podiatry

## 2021-01-17 DIAGNOSIS — M6740 Ganglion, unspecified site: Secondary | ICD-10-CM

## 2021-01-17 DIAGNOSIS — M79672 Pain in left foot: Secondary | ICD-10-CM

## 2021-01-17 NOTE — Progress Notes (Signed)
  Subjective:  Patient ID: Nicole Aguilar, female    DOB: 19-May-1960,  MRN: [id number]  Chief Complaint  Patient presents with   Routine Post Op      POV #1 DOS 01/11/2021 LT FOOT EXCISION OF GANGLION CYST    DOS: 01/11/21 Procedure: Excision of ganglion cyst left foot  60 y.o. female presents with the above complaint. History confirmed with patient. Doing well had a little more pain yesterday than previous otherwise doing well post-operatively  Objective:  Physical Exam: tenderness at the surgical site, local edema noted, and calf supple, nontender. Incision: healing well, no significant drainage, no dehiscence, no significant erythema Assessment:   1. Ganglion cyst   2. Pain in left foot    Plan:  Patient was evaluated and treated and all questions answered.  Post-operative State -Ok to start showering at this time. Advised they cannot soak. -Dressing applied consisting of sterile gauze, kerlix, and ACE bandage -WBAT in Surgical shoe -Surgical shoe dispensed -XRs needed at follow-up: none   No follow-ups on file.

## 2021-01-22 ENCOUNTER — Other Ambulatory Visit

## 2021-01-27 ENCOUNTER — Ambulatory Visit
Admission: RE | Admit: 2021-01-27 | Discharge: 2021-01-27 | Disposition: A | Discharge status: Home / Self Care | Source: Ambulatory Visit | Attending: Chiropractic Medicine | Admitting: Chiropractic Medicine

## 2021-01-27 ENCOUNTER — Other Ambulatory Visit: Payer: Self-pay

## 2021-01-27 ENCOUNTER — Encounter: Payer: Self-pay | Admitting: Podiatry

## 2021-01-27 ENCOUNTER — Ambulatory Visit (INDEPENDENT_AMBULATORY_CARE_PROVIDER_SITE_OTHER): Admitting: Podiatry

## 2021-01-27 DIAGNOSIS — M545 Low back pain, unspecified: Secondary | ICD-10-CM

## 2021-01-27 DIAGNOSIS — M5418 Radiculopathy, sacral and sacrococcygeal region: Secondary | ICD-10-CM

## 2021-01-27 DIAGNOSIS — M79672 Pain in left foot: Secondary | ICD-10-CM

## 2021-01-27 DIAGNOSIS — M6740 Ganglion, unspecified site: Secondary | ICD-10-CM

## 2021-01-31 ENCOUNTER — Encounter: Admitting: Podiatry

## 2021-02-03 NOTE — Progress Notes (Signed)
  Subjective:  Patient ID: Nicole Aguilar, female    DOB: 03/10/1961,  MRN: [id number]  Chief Complaint  Patient presents with   Routine Post Op    POS DOS 9.14.22     DOS: 01/11/21 Procedure: Excision of ganglion cyst left foot  60 y.o. female presents with the above complaint. History confirmed with patient.  Having pain and swelling postoperatively but otherwise denies new concerns  Objective:  Physical Exam: tenderness at the surgical site, local edema noted, and calf supple, nontender. Incision: healing well, no significant drainage, no dehiscence, no significant erythema Assessment:   1. Ganglion cyst   2. Pain in left foot     Plan:  Patient was evaluated and treated and all questions answered.  Post-operative State -Sutures removed.  Steri-Strips applied. -Okay to wear shoe gear that does not rub on the dorsal incision area.  She can transition to normal shoe gear as tolerated. -There is not appear to be signs of the cyst -Follow-up in 1 month for recheck -XRs needed at follow-up: none   No follow-ups on file.

## 2021-02-23 ENCOUNTER — Emergency Department (HOSPITAL_COMMUNITY)

## 2021-02-23 ENCOUNTER — Emergency Department (HOSPITAL_COMMUNITY)
Admission: EM | Admit: 2021-02-23 | Discharge: 2021-02-23 | Disposition: A | Discharge status: Home / Self Care | Attending: Emergency Medicine | Admitting: Emergency Medicine

## 2021-02-23 ENCOUNTER — Encounter (HOSPITAL_COMMUNITY): Payer: Self-pay

## 2021-02-23 DIAGNOSIS — M79672 Pain in left foot: Secondary | ICD-10-CM | POA: Diagnosis not present

## 2021-02-23 DIAGNOSIS — M25511 Pain in right shoulder: Secondary | ICD-10-CM | POA: Diagnosis not present

## 2021-02-23 DIAGNOSIS — M545 Low back pain, unspecified: Secondary | ICD-10-CM | POA: Diagnosis not present

## 2021-02-23 DIAGNOSIS — Z23 Encounter for immunization: Secondary | ICD-10-CM | POA: Insufficient documentation

## 2021-02-23 DIAGNOSIS — M7918 Myalgia, other site: Secondary | ICD-10-CM

## 2021-02-23 DIAGNOSIS — R0789 Other chest pain: Secondary | ICD-10-CM | POA: Diagnosis not present

## 2021-02-23 DIAGNOSIS — S0990XA Unspecified injury of head, initial encounter: Secondary | ICD-10-CM | POA: Insufficient documentation

## 2021-02-23 DIAGNOSIS — M791 Myalgia, unspecified site: Secondary | ICD-10-CM | POA: Diagnosis not present

## 2021-02-23 DIAGNOSIS — M25562 Pain in left knee: Secondary | ICD-10-CM | POA: Diagnosis present

## 2021-02-23 DIAGNOSIS — M542 Cervicalgia: Secondary | ICD-10-CM | POA: Diagnosis not present

## 2021-02-23 DIAGNOSIS — Y92410 Unspecified street and highway as the place of occurrence of the external cause: Secondary | ICD-10-CM | POA: Diagnosis not present

## 2021-02-23 MED ORDER — METHOCARBAMOL 500 MG PO TABS: 500.0000 mg | ORAL_TABLET | Freq: Two times a day (BID) | ORAL | 0 refills | Status: DC

## 2021-02-23 MED ORDER — TETANUS-DIPHTH-ACELL PERTUSSIS 5-2.5-18.5 LF-MCG/0.5 IM SUSY
0.5000 mL | PREFILLED_SYRINGE | Freq: Once | INTRAMUSCULAR | Status: AC
  Administered 2021-02-23: 0.5 mL via INTRAMUSCULAR
  Filled 2021-02-23: qty 0.5

## 2021-02-23 NOTE — Progress Notes (Signed)
Orthopedic Tech Progress Note Patient Details:  LUTIE PICKLER 29-Sep-1960 [id number]  Ortho Devices Type of Ortho Device: Knee Immobilizer, Crutches Ortho Device/Splint Location: LLE Ortho Device/Splint Interventions: Ordered, Application, Adjustment   Post Interventions Patient Tolerated: Well, Ambulated well Instructions Provided: Poper ambulation with device, Adjustment of device  [place] A Darica Goren 02/23/2021, 7:19 PM

## 2021-02-23 NOTE — Discharge Instructions (Addendum)
You may alternate taking Tylenol and Naproxen as needed for pain control. You may take Naproxen twice daily as directed on your discharge paperwork and you may take  [phone] mg of Tylenol every 6 hours. Do not exceed 4000 mg of Tylenol daily as this can lead to liver damage. Also, make sure to take Naproxen with meals as it can cause an upset stomach. Do not take other NSAIDs while taking Naproxen such as (Aleve, Ibuprofen, Aspirin, Celebrex, etc) and do not take more than the prescribed dose as this can lead to ulcers and bleeding in your GI tract. You may use warm and cold compresses to help with your symptoms.   You were given a prescription for Robaxin which is a muscle relaxer.  You should not drive, work, or operate machinery while taking this medication as it can make you very drowsy.    Please follow up with your primary doctor within the next 7-10 days for re-evaluation and further treatment of your symptoms.   Please return to the ER sooner if you have any new or worsening symptoms.
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
## 2021-02-28 DIAGNOSIS — M79671 Pain in right foot: Secondary | ICD-10-CM | POA: Insufficient documentation

## 2021-10-07 IMAGING — MR MR PELVIS W/O CM
4 of 5 series · 29 of 48 positions shown · non-contrast
Comparison: None.
COMPARISON: None.

Addendum:
CLINICAL DATA: Chronic low back pain. Bilateral buttock pain and
leg pain for multiple years. Chronic right hip pain.

EXAM:
MRI PELVIS WITHOUT CONTRAST
TECHNIQUE: Multiplanar multisequence MR imaging of the pelvis was performed. No
intravenous contrast was administered.

[Series 2: T2 fat-sat · sagittal · 4.0mm · 0.47mm/px · 8 of 62 slices shown]
[im 1/62]
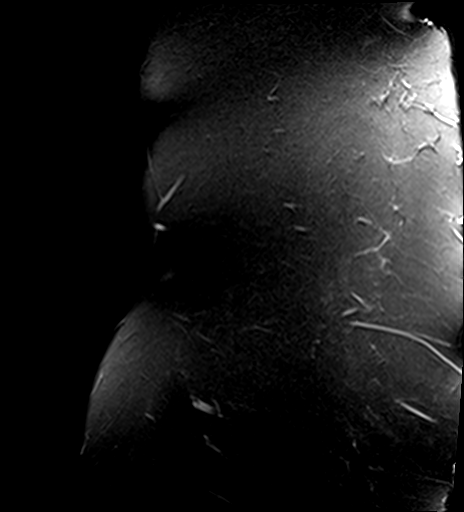
[im 10/62]
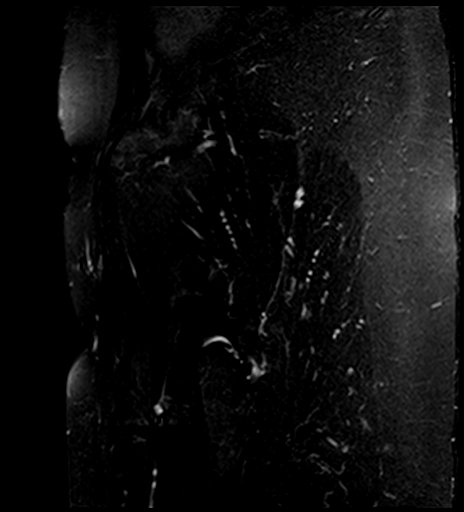
[im 19/62]
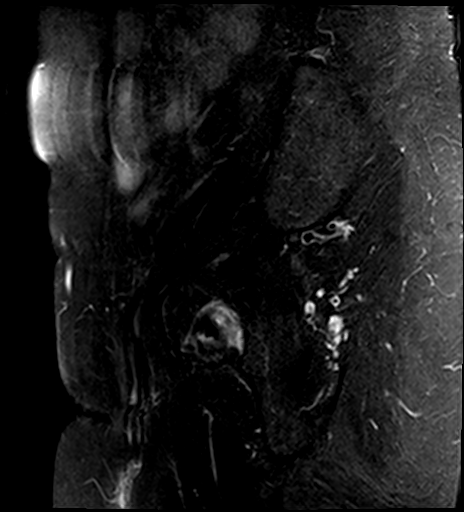
[im 29/62]
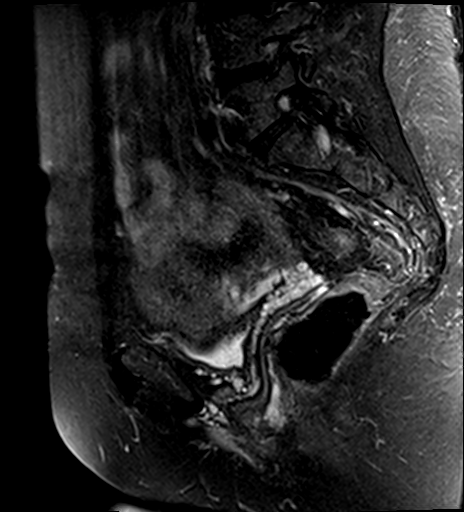
[im 33/62]
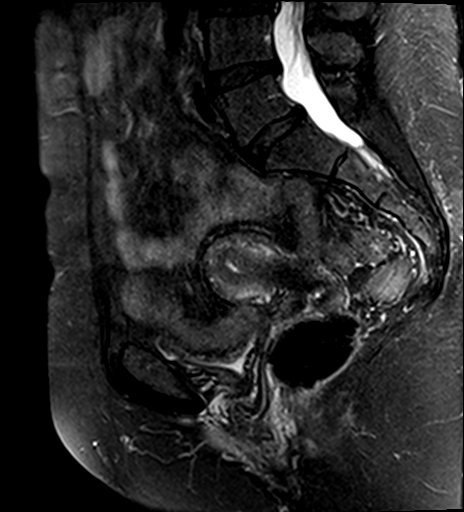
[im 43/62]
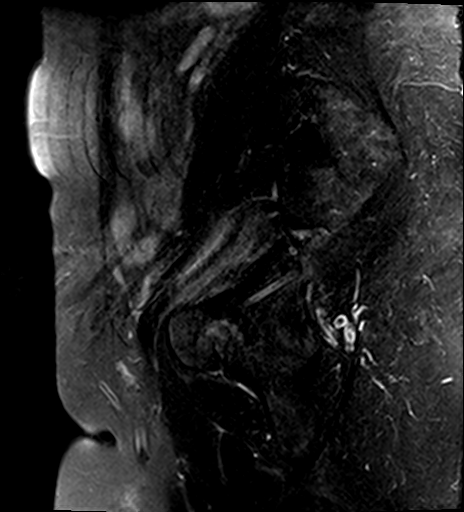
[im 52/62]
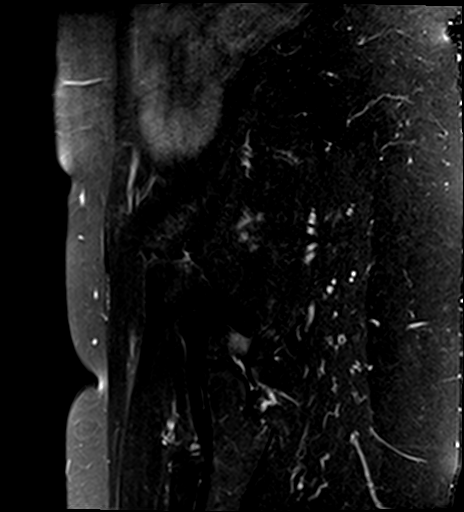
[im 62/62]
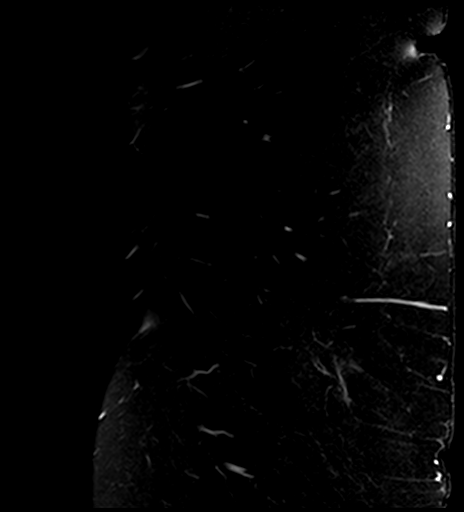

[Series 3: T1 · coronal · 4.0mm · 1.56mm/px · 8 of 36 slices shown (1 of 2)]
[im 1/36]
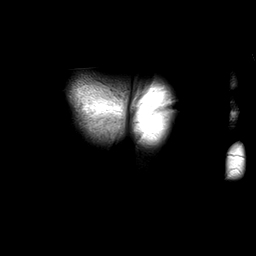
[im 6/36]
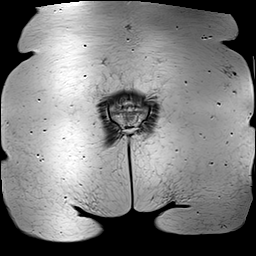
[im 11/36]
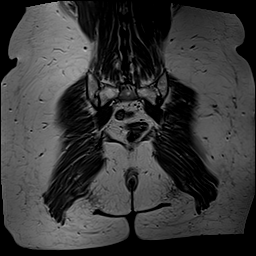
[im 16/36]
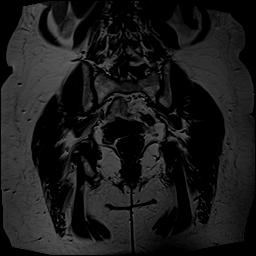
[im 21/36]
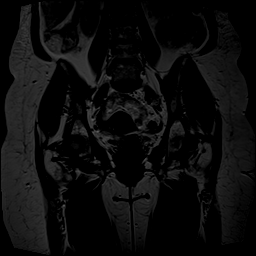
[im 26/36]
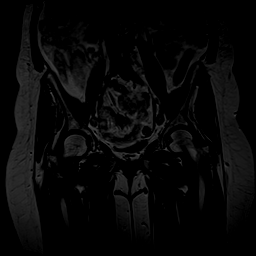
[im 31/36]
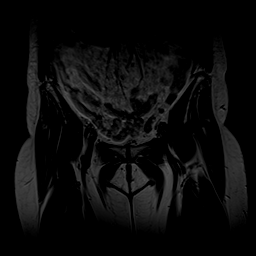
[im 36/36]
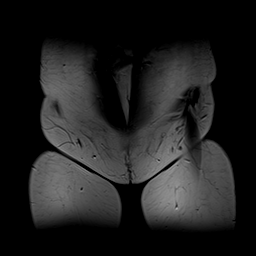

[Series 4: STIR · coronal · 4.0mm · 1.56mm/px · 8 of 36 slices shown]
[im 1/36]
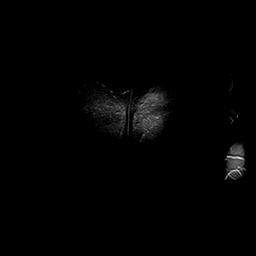
[im 6/36]
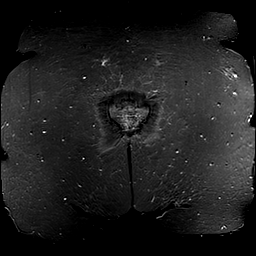
[im 11/36]
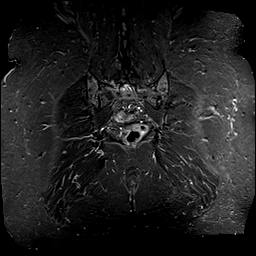
[im 16/36]
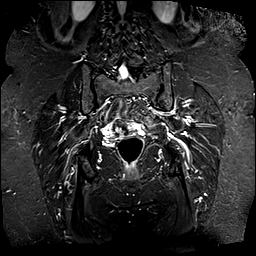
[im 21/36]
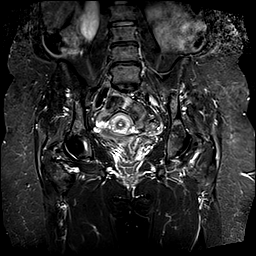
[im 26/36]
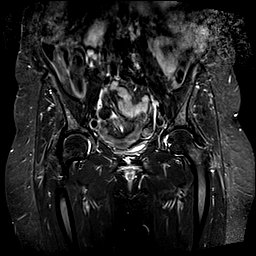
[im 31/36]
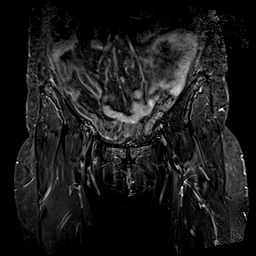
[im 36/36]
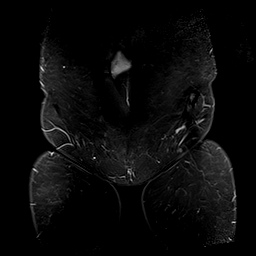

[Series 5: T1 · axial · 4.0mm · 0.59mm/px · z∈[-74,+101]mm · 5 of 42 slices shown (2 of 2)]
[im 1/42]
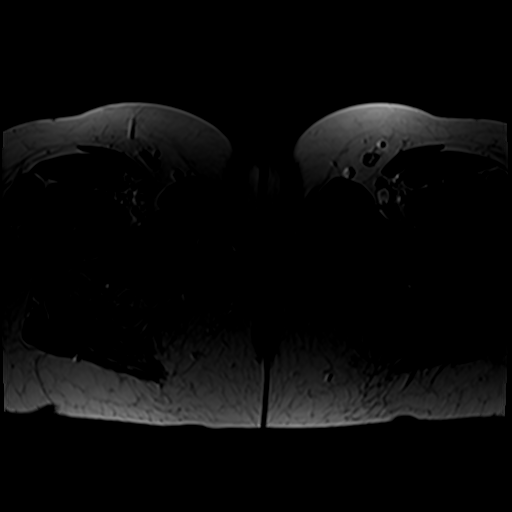
[im 6/42]
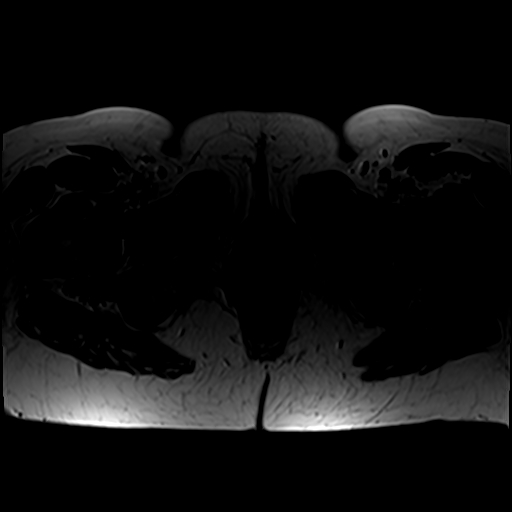
[im 11/42]
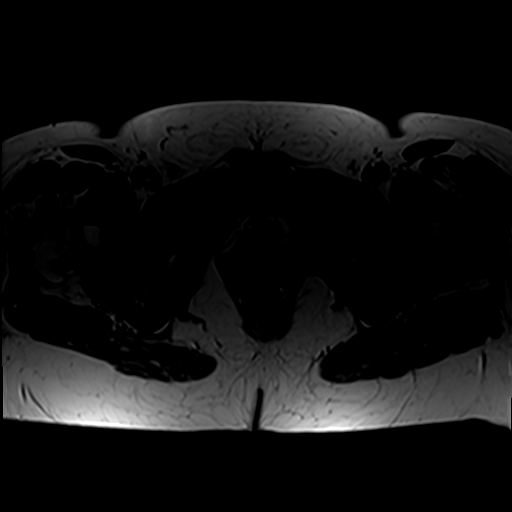
[im 21/42]
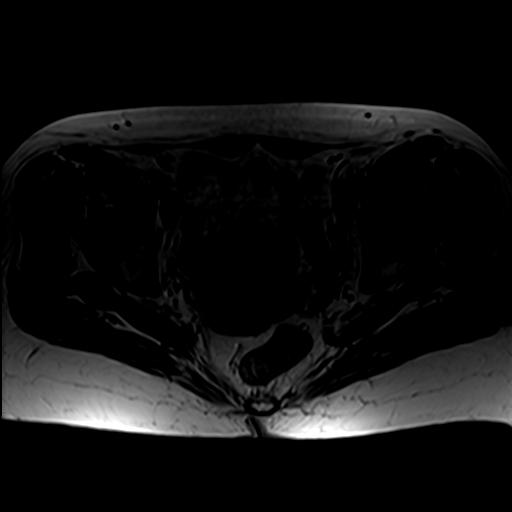
[im 36/42]
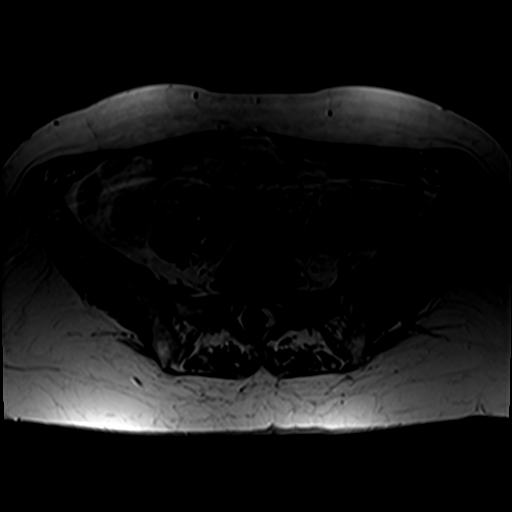

[29 of 48 positions shown; findings below may reference images not displayed]

FINDINGS: Bones:

No hip fracture, dislocation or avascular necrosis.

No periosteal reaction or bone destruction. No aggressive osseous
lesion.

Normal sacrum and sacroiliac joints. No SI joint widening or erosive
changes.

Degenerative disease with disc height loss at L3-4, L4-5 and L5-S1.
Mild broad-based disc bulge with a central annular fissure at L3-4.
Mild broad-based disc bulge at L4-5 with bilateral facet arthropathy
and foraminal narrowing. At L5-S1 there is a broad-based disc bulge
with a right paracentral disc protrusion and right foraminal
narrowing.

Articular cartilage and labrum

Articular cartilage:  No chondral defect.

Labrum: Grossly intact, but evaluation is limited by lack of
intraarticular fluid.

Joint or bursal effusion

Joint effusion:  No hip joint effusion.  No SI joint effusion.

Bursae:  No bursa formation.

Muscles and tendons

Flexors: Normal.

Extensors: Normal.

Abductors: Normal.

Adductors: Normal.

Gluteals: Mild tendinosis of the right gluteus minimus tendon
insertion. High-grade partial versus complete tear of the left
gluteus minimus tendon insertion.

Hamstrings: Mild tendinosis of the left hamstring origin.

Other findings

No pelvic free fluid. No fluid collection or hematoma. No inguinal
lymphadenopathy. No inguinal hernia.
IMPRESSION: 1. Lower lumbar spine spondylosis better characterized on MRI lumbar
spine performed same day.
2. No hip fracture, dislocation or avascular necrosis.
3. Mild tendinosis of the right gluteus minimus tendon insertion.
High-grade partial versus complete tear of the left gluteus minimus
tendon insertion.

ADDENDUM:
Minimal T2 hyperintense signal at the level of S4 without
significant T1 signal abnormality which may reflect poor fat
saturation versus mild stress reaction without a fracture. No
surrounding soft tissue edema.

*** End of Addendum ***
FINDINGS: Bones:

No hip fracture, dislocation or avascular necrosis.

No periosteal reaction or bone destruction. No aggressive osseous
lesion.

Normal sacrum and sacroiliac joints. No SI joint widening or erosive
changes.

Degenerative disease with disc height loss at L3-4, L4-5 and L5-S1.
Mild broad-based disc bulge with a central annular fissure at L3-4.
Mild broad-based disc bulge at L4-5 with bilateral facet arthropathy
and foraminal narrowing. At L5-S1 there is a broad-based disc bulge
with a right paracentral disc protrusion and right foraminal
narrowing.

Articular cartilage and labrum

Articular cartilage:  No chondral defect.

Labrum: Grossly intact, but evaluation is limited by lack of
intraarticular fluid.

Joint or bursal effusion

Joint effusion:  No hip joint effusion.  No SI joint effusion.

Bursae:  No bursa formation.

Muscles and tendons

Flexors: Normal.

Extensors: Normal.

Abductors: Normal.

Adductors: Normal.

Gluteals: Mild tendinosis of the right gluteus minimus tendon
insertion. High-grade partial versus complete tear of the left
gluteus minimus tendon insertion.

Hamstrings: Mild tendinosis of the left hamstring origin.

Other findings

No pelvic free fluid. No fluid collection or hematoma. No inguinal
lymphadenopathy. No inguinal hernia.
IMPRESSION: 1. Lower lumbar spine spondylosis better characterized on MRI lumbar
spine performed same day.
2. No hip fracture, dislocation or avascular necrosis.
3. Mild tendinosis of the right gluteus minimus tendon insertion.
High-grade partial versus complete tear of the left gluteus minimus
tendon insertion.

## 2021-11-21 DIAGNOSIS — Z803 Family history of malignant neoplasm of breast: Secondary | ICD-10-CM | POA: Insufficient documentation

## 2021-11-21 HISTORY — DX: Family history of malignant neoplasm of breast: Z80.3

## 2022-02-27 DIAGNOSIS — I4510 Unspecified right bundle-branch block: Secondary | ICD-10-CM | POA: Diagnosis not present

## 2022-07-17 ENCOUNTER — Encounter: Payer: Self-pay | Admitting: *Deleted

## 2022-07-17 ENCOUNTER — Encounter: Payer: Self-pay | Admitting: Cardiology

## 2022-07-17 DIAGNOSIS — R1031 Right lower quadrant pain: Secondary | ICD-10-CM | POA: Insufficient documentation

## 2022-07-17 DIAGNOSIS — E876 Hypokalemia: Secondary | ICD-10-CM | POA: Insufficient documentation

## 2022-07-17 DIAGNOSIS — K635 Polyp of colon: Secondary | ICD-10-CM | POA: Insufficient documentation

## 2022-07-17 DIAGNOSIS — E559 Vitamin D deficiency, unspecified: Secondary | ICD-10-CM

## 2022-07-17 DIAGNOSIS — D649 Anemia, unspecified: Secondary | ICD-10-CM | POA: Insufficient documentation

## 2022-07-17 HISTORY — DX: Right lower quadrant pain: R10.31

## 2022-07-17 HISTORY — DX: Vitamin D deficiency, unspecified: E55.9

## 2022-07-17 HISTORY — DX: Hypokalemia: E87.6

## 2022-07-17 HISTORY — DX: Polyp of colon: K63.5

## 2022-08-15 ENCOUNTER — Encounter: Payer: Self-pay | Admitting: Cardiology

## 2022-08-15 ENCOUNTER — Ambulatory Visit: Attending: Cardiology

## 2022-08-15 ENCOUNTER — Ambulatory Visit: Attending: Cardiology | Admitting: Cardiology

## 2022-08-15 VITALS — BP 120/80 | HR 62 | Ht 61.0 in | Wt 159.8 lb

## 2022-08-15 DIAGNOSIS — R072 Precordial pain: Secondary | ICD-10-CM

## 2022-08-15 DIAGNOSIS — R0789 Other chest pain: Secondary | ICD-10-CM

## 2022-08-15 DIAGNOSIS — R002 Palpitations: Secondary | ICD-10-CM | POA: Diagnosis not present

## 2022-08-15 DIAGNOSIS — R0609 Other forms of dyspnea: Secondary | ICD-10-CM

## 2022-08-15 NOTE — Progress Notes (Signed)
Cardiology Consultation:    Date:  08/15/2022   ID:  Nicole Aguilar, DOB 05-10-1960, MRN [id number]  PCP:  Marylen Ponto, MD  Cardiologist:  Gypsy Balsam, MD   Referring MD: Marcellus Scott, MD   Chief Complaint  Patient presents with   Shortness of Breath        Leg Swelling    Ongoing since 05/2022   elevated HR    History of Present Illness:    Nicole Aguilar is a 62 y.o. female who is being seen today for the evaluation of shortness of breath and elevated heart rate at the request of Chodri, Eual Fines, MD. past medical history significant for lymphoma, palpitations, bronchiectasis, she was referred to [place] by pulmonologist because of dyspnea on exertion and desaturation while walking on top of that she does complain of having some uneasy sensation when she walks.  She said it is a gradual onset over the last few months.  More may be many years.  She also described to have some palpitations when she feel her heart speeding up for few seconds typically happening at rest.  She have difficulty walking long distance because of shortness of breath and fatigue tiredness.  She does not smoke, does have family history of coronary artery disease but not premature.  She does not exercise on a regular basis she is not on any special diet  Past Medical History:  Diagnosis Date   Abdominal pain 10/24/2011   Abnormal cervical Papanicolaou smear 07/22/2020   Abnormal Pap smear, high grade squamous intraepithelial lesion (HGSIL) 04/12/2011   Anemia    Chronic diarrhea 12/23/2015   Chronic headaches 04/30/2006   Chronic interstitial cystitis 04/18/2011   Chronic migraine without aura without status migrainosus, not intractable 10/08/2011   Colon polyp 07/17/2022   Dysrhythmia 04/30/2010   Holter monitor showed occ atrial flutter- no meds prescribed   Dysuria 10/24/2011   Early satiety 12/23/2015   FH: breast cancer in first degree relative 11/21/2021   Fibromyalgia 04/30/2002    Dr. 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 Logan Bores   Irritable bowel syndrome 10/24/2011   Lymphoma 04/30/2008   grade1 follicular.  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   Right lower quadrant abdominal pain 07/17/2022   Sacroiliitis 03/04/2015   Sinusitis 02/11/2013   Vaginitis 02/11/2013   Vitamin D deficiency 07/17/2022   last checked 11/2014    Past Surgical History:  Procedure Laterality Date   CERVICAL CONIZATION W/BX  10/19/2011   Procedure: CONIZATION CERVIX WITH BIOPSY;  Surgeon: Hal Morales, MD;  Location: [facility];  Service: Gynecology;  Laterality: N/A;  Cold Knife Conization   [facility]     COLONOSCOPY     DILATION AND CURETTAGE OF UTERUS  2011   PORTACATH PLACEMENT     used for chemo.Marland KitchenMarland KitchenNOT for IV access    Current Medications: Current Meds  Medication Sig   albuterol (VENTOLIN HFA) 108 (90 Base) MCG/ACT inhaler Inhale 2 puffs into the  lungs every 6 (six) hours as needed for wheezing or shortness of breath.   benzonatate (TESSALON) 200 MG capsule Take 200 mg by mouth 3 (three) times daily as needed for cough.   estradiol-norethindrone (COMBIPATCH) 0.05-0.14 MG/DAY Place 1 patch onto the skin 2 (two) times a week.   fluticasone (FLONASE) 50 MCG/ACT nasal spray Place 1 spray into both nostrils 2 (two) times daily.   Fluticasone-Umeclidin-Vilant (TRELEGY ELLIPTA) 200-62.5-25 MCG/ACT AEPB Inhale 1 puff into the lungs daily.   LORazepam (ATIVAN) 1 MG  tablet Take 1 mg by mouth daily as needed. For upset stomach if other medications ineffective   omeprazole (PRILOSEC) 40 MG capsule Take 40 mg by mouth daily.   oxybutynin (OXYTROL) 3.9 MG/24HR Place 1 patch onto the skin every 3 (three) days.   pentosan polysulfate (ELMIRON) 100 MG capsule Take 200 mg by mouth 2 (two) times daily.    Rimegepant Sulfate (NURTEC) 75 MG TBDP Place 75 mg under the tongue daily.   traZODone (DESYREL) 50 MG tablet Take 25 mg by mouth at bedtime.   [DISCONTINUED] estradiol (CLIMARA - DOSED IN MG/24 HR) 0.0375 mg/24hr patch Place 0.0375 mg onto the skin once a week.     Allergies:   Shellfish allergy   Social History   Socioeconomic History   Marital status: Married    Spouse name: Not on file   Number of children: Not on file   Years of education: Not on file   Highest education level: Not on file  Occupational History   Not on file  Tobacco Use   Smoking status: Never   Smokeless tobacco: Never  Substance and Sexual Activity   Alcohol use: No   Drug use: No   Sexual activity: Yes    Birth control/protection: Patch  Other Topics Concern   Not on file  Social History Narrative   Not on file   Social Determinants of Health   Financial Resource Strain: Not on file  Food Insecurity: Not on file  Transportation Needs: Not on file  Physical Activity: Not on file  Stress: Not on file  Social Connections: Not on file     Family History: The patient's family history includes Alzheimer's disease in her father and paternal aunt; Asthma in her mother; Cancer in her maternal grandmother and paternal aunt; Diabetes in her maternal uncle; Heart disease in her father, maternal grandfather, maternal grandmother, and paternal grandmother; Stroke in her father. ROS:   Please see the history of present illness.    All 14 point review of systems negative except as described per history of present illness.  EKGs/Labs/Other Studies Reviewed:    The following  studies were reviewed today:   EKG:  EKG is  ordered today.  The ekg ordered today demonstrates normal sinus rhythm normal P interval normal QS complex duration fulgent incomplete right bundle branch block.  Recent Labs: No results found for requested labs within last 365 days.  Recent Lipid Panel No results found for: "CHOL", "TRIG", "HDL", "CHOLHDL", "VLDL", "LDLCALC", "LDLDIRECT"  Physical Exam:    VS:  BP 120/80 (BP Location: Left Arm, Patient Position: Sitting)   Pulse 62   Ht 5\' 1"  (1.549 m)   Wt 159 lb 12.8 oz (72.5 kg)   LMP 02/28/2010   SpO2 96%   BMI 30.19 kg/m     Wt Readings from Last 3 Encounters:  08/15/22 159 lb 12.8 oz (72.5 kg)  07/04/22 161 lb (73 kg)  02/23/21 148 lb (67.1 kg)  GEN:  Well nourished, well developed in no acute distress HEENT: Normal NECK: No JVD; No carotid bruits LYMPHATICS: No lymphadenopathy CARDIAC: RRR, no murmurs, no rubs, no gallops RESPIRATORY:  Clear to auscultation without rales, wheezing or rhonchi  ABDOMEN: Soft, non-tender, non-distended MUSCULOSKELETAL:  No edema; No deformity  SKIN: Warm and dry NEUROLOGIC:  Alert and oriented x 3 PSYCHIATRIC:  Normal affect   ASSESSMENT:    1. Precordial pain   2. Atypical chest pain   3. Palpitations   4. Dyspnea on exertion    PLAN:    In order of problems listed above:  Precordial chest pain which is somewhat atypical but she does have risk factors for coronary artery disease.  I will ask her to have coronary CT angio that she is still if she get any coronary artery disease. Palpitations.  Will ask her to wear monitor to see if she get any significant arrhythmia. Dyspnea on exertion probably multifactorial but in my opinion significant role for pulmonary issue placed here.  She did have echocardiogram done by pulmonologist office, 8 show preserved left ventricle ejection fraction however there is no mentioning about diastolic function.  Will do proBNP try to examine if you  have any problem and systolic dysfunction.   Medication Adjustments/Labs and Tests Ordered: Current medicines are reviewed at length with the patient today.  Concerns regarding medicines are outlined above.  Orders Placed This Encounter  Procedures   CT CORONARY MORPH W/CTA COR W/SCORE W/CA W/CM &/OR WO/CM   LONG TERM MONITOR (3-14 DAYS)   EKG 12-Lead   No orders of the defined types were placed in this encounter.   Signed, Georgeanna Lea, MD, [facility]. 08/15/2022 5:05 PM    [facility]

## 2022-08-15 NOTE — Patient Instructions (Signed)
Medication Instructions:   Take: Metoprolol  1 tablet 2 hours prior to CT Scan  *If you need a refill on your cardiac medications before your next appointment, please call your pharmacy*   Lab Work: Your physician recommends that you return for lab work in: 1 week prior to CT Scan You need to have labs done when you are fasting.  You can come Monday through Friday 8:00 am to 12:00 pm and 1:00 to 4:30. You do not need to make an appointment as the order has already been placed. The labs you are going to have done are BMET.    Testing/Procedures: Your physician has requested that you have cardiac CT. Cardiac computed tomography (CT) is a painless test that uses an x-ray machine to take clear, detailed pictures of your heart. For further information please visit [email or web address] Please follow instruction sheet as given.    Your Cardiac CT will be scheduled at:   [facility] located off [facility] at the hospital.  Please arrive 30 minutes prior to your appointment time.  You can use the FREE valet parking offered at entrance to outpatient center (encouraged to control the heart rate for the test)   Please follow these instructions carefully (unless otherwise directed):   On the Night Before the Test: Be sure to Drink plenty of water. Do not consume any caffeinated/decaffeinated beverages or chocolate 12 hours prior to your test. Do not take any antihistamines 12 hours prior to your test.   On the Day of the Test: Drink plenty of water until 1 hour prior to the test. Do not eat any food 4 hours prior to the test. No smoking 4 hours prior to test. You may take your regular medications prior to the test.  Take metoprolol (Lopressor) two hours prior to test. FEMALES- please wear underwire-free bra if available, avoid dresses & tight clothing. Wear plain shirt no beads, sparkles, rhinestones, metal or heavy embroidery.  After the Test: Drink plenty  of water. After receiving IV contrast, you may experience a mild flushed feeling. This is normal. On occasion, you may experience a mild rash up to 24 hours after the test. This is not dangerous. If this occurs, you can take Benadryl 25 mg and increase your fluid intake. If you experience trouble breathing, this can be serious. If it is severe call 911 IMMEDIATELY. If it is mild, please call our office. If you take any of these medications: Glipizide/Metformin, Avandament, Glucavance, please do not take 48 hours after completing test unless otherwise instructed.  We will call to schedule your test 2-4 weeks out understanding that some insurance companies will need an authorization prior to the service being performed.      Follow-Up: At [facility], you and your health needs are our priority.  As part of our continuing mission to provide you with exceptional heart care, we have created designated Provider Care Teams.  These Care Teams include your primary Cardiologist (physician) and Advanced Practice Providers (APPs -  Physician Assistants and Nurse Practitioners) who all work together to provide you with the care you need, when you need it.  We recommend signing up for the patient portal called "MyChart".  Sign up information is provided on this After Visit Summary.  MyChart is used to connect with patients for Virtual Visits (Telemedicine).  Patients are able to view lab/test results, encounter notes, upcoming appointments, etc.  Non-urgent messages can be sent to your provider as well.   To  learn more about what you can do with MyChart, go to [email or web address].    Your next appointment:   2 month(s)  The format for your next appointment:   In Person  Provider:   Gypsy Balsam, MD    Other Instructions Cardiac CT Angiogram A cardiac CT angiogram is a procedure to look at the heart and the area around the heart. It may be done to help find the cause of chest pains or  other symptoms of heart disease. During this procedure, a substance called contrast dye is injected into the blood vessels in the area to be checked. A large X-ray machine, called a CT scanner, then takes detailed pictures of the heart and the surrounding area. The procedure is also sometimes called a coronary CT angiogram, coronary artery scanning, or CTA. A cardiac CT angiogram allows the health care provider to see how well blood is flowing to and from the heart. The health care provider will be able to see if there are any problems, such as: Blockage or narrowing of the coronary arteries in the heart. Fluid around the heart. Signs of weakness or disease in the muscles, valves, and tissues of the heart. Tell a health care provider about: Any allergies you have. This is especially important if you have had a previous allergic reaction to contrast dye. All medicines you are taking, including vitamins, herbs, eye drops, creams, and over-the-counter medicines. Any blood disorders you have. Any surgeries you have had. Any medical conditions you have. Whether you are pregnant or may be pregnant. Any anxiety disorders, chronic pain, or other conditions you have that may increase your stress or prevent you from lying still. What are the risks? Generally, this is a safe procedure. However, problems may occur, including: Bleeding. Infection. Allergic reactions to medicines or dyes. Damage to other structures or organs. Kidney damage from the contrast dye that is used. Increased risk of cancer from radiation exposure. This risk is low. Talk with your health care provider about: The risks and benefits of testing. How you can receive the lowest dose of radiation. What happens before the procedure? Wear comfortable clothing and remove any jewelry, glasses, dentures, and hearing aids. Follow instructions from your health care provider about eating and drinking. This may include: For 12 hours before the  procedure -- avoid caffeine. This includes tea, coffee, soda, energy drinks, and diet pills. Drink plenty of water or other fluids that do not have caffeine in them. Being well hydrated can prevent complications. For 4-6 hours before the procedure -- stop eating and drinking. The contrast dye can cause nausea, but this is less likely if your stomach is empty. Ask your health care provider about changing or stopping your regular medicines. This is especially important if you are taking diabetes medicines, blood thinners, or medicines to treat problems with erections (erectile dysfunction). What happens during the procedure?  Hair on your chest may need to be removed so that small sticky patches called electrodes can be placed on your chest. These will transmit information that helps to monitor your heart during the procedure. An IV will be inserted into one of your veins. You might be given a medicine to control your heart rate during the procedure. This will help to ensure that good images are obtained. You will be asked to lie on an exam table. This table will slide in and out of the CT machine during the procedure. Contrast dye will be injected into the IV. You might feel warm,  or you may get a metallic taste in your mouth. You will be given a medicine called nitroglycerin. This will relax or dilate the arteries in your heart. The table that you are lying on will move into the CT machine tunnel for the scan. The person running the machine will give you instructions while the scans are being done. You may be asked to: Keep your arms above your head. Hold your breath. Stay very still, even if the table is moving. When the scanning is complete, you will be moved out of the machine. The IV will be removed. The procedure may vary among health care providers and hospitals. What can I expect after the procedure? After your procedure, it is common to have: A metallic taste in your mouth from the  contrast dye. A feeling of warmth. A headache from the nitroglycerin. Follow these instructions at home: Take over-the-counter and prescription medicines only as told by your health care provider. If you are told, drink enough fluid to keep your urine pale yellow. This will help to flush the contrast dye out of your body. Most people can return to their normal activities right after the procedure. Ask your health care provider what activities are safe for you. It is up to you to get the results of your procedure. Ask your health care provider, or the department that is doing the procedure, when your results will be ready. Keep all follow-up visits as told by your health care provider. This is important. Contact a health care provider if: You have any symptoms of allergy to the contrast dye. These include: Shortness of breath. Rash or hives. A racing heartbeat. Summary A cardiac CT angiogram is a procedure to look at the heart and the area around the heart. It may be done to help find the cause of chest pains or other symptoms of heart disease. During this procedure, a large X-ray machine, called a CT scanner, takes detailed pictures of the heart and the surrounding area after a contrast dye has been injected into blood vessels in the area. Ask your health care provider about changing or stopping your regular medicines before the procedure. This is especially important if you are taking diabetes medicines, blood thinners, or medicines to treat erectile dysfunction. If you are told, drink enough fluid to keep your urine pale yellow. This will help to flush the contrast dye out of your body. This information is not intended to replace advice given to you by your health care provider. Make sure you discuss any questions you have with your health care provider. Document Revised: 08/03/2021 Document Reviewed: 12/10/2018 Elsevier Patient Education  2023 Elsevier Inc.   WHY IS MY DOCTOR PRESCRIBING  ZIO? The Zio system is proven and trusted by physicians to detect and diagnose irregular heart rhythms -- and has been prescribed to hundreds of thousands of patients.  The FDA has cleared the Zio system to monitor for many different kinds of irregular heart rhythms. In a study, physicians were able to reach a diagnosis 90% of the time with the Zio system1.  You can wear the Zio monitor -- a small, discreet, comfortable patch -- during your normal day-to-day activity, including while you sleep, shower, and exercise, while it records every single heartbeat for analysis.  1Barrett, P., et al. Comparison of 24 Hour Holter Monitoring Versus 14 Day Novel Adhesive Patch Electrocardiographic Monitoring. American Journal of Medicine, 2014.  ZIO VS. HOLTER MONITORING The Zio monitor can be comfortably worn for up to  14 days. Holter monitors can be worn for 24 to 48 hours, limiting the time to record any irregular heart rhythms you may have. Zio is able to capture data for the 51% of patients who have their first symptom-triggered arrhythmia after 48 hours.1  LIVE WITHOUT RESTRICTIONS The Zio ambulatory cardiac monitor is a small, unobtrusive, and water-resistant patch--you might even forget you're wearing it. The Zio monitor records and stores every beat of your heart, whether you're sleeping, working out, or showering.     Important Information About Sugar

## 2022-08-17 MED ORDER — METOPROLOL TARTRATE 50 MG PO TABS: ORAL_TABLET | ORAL | 0 refills | Status: DC

## 2022-08-17 NOTE — Addendum Note (Signed)
Addended by: Baldo Ash D on: 08/17/2022 09:56 AM   Modules accepted: Orders

## 2022-08-23 DIAGNOSIS — Z8719 Personal history of other diseases of the digestive system: Secondary | ICD-10-CM | POA: Insufficient documentation

## 2022-09-03 ENCOUNTER — Encounter: Payer: Self-pay | Admitting: Cardiology

## 2022-10-12 ENCOUNTER — Telehealth: Payer: Self-pay

## 2022-10-12 NOTE — Telephone Encounter (Signed)
Spoke with pt regarding monitor results. She will speak with Dr. Bing Matter at her next appt about her symptoms and treatment. Routed to PCP.

## 2022-10-17 DIAGNOSIS — I728 Aneurysm of other specified arteries: Secondary | ICD-10-CM | POA: Insufficient documentation

## 2022-10-19 ENCOUNTER — Ambulatory Visit: Admitting: Cardiology

## 2022-11-13 ENCOUNTER — Ambulatory Visit: Admitting: Cardiology

## 2023-02-11 ENCOUNTER — Encounter: Payer: Self-pay | Admitting: Cardiology

## 2023-02-11 ENCOUNTER — Ambulatory Visit: Attending: Cardiology | Admitting: Cardiology

## 2023-02-11 VITALS — BP 142/90 | HR 77 | Ht 61.0 in | Wt 159.8 lb

## 2023-02-11 DIAGNOSIS — I2510 Atherosclerotic heart disease of native coronary artery without angina pectoris: Secondary | ICD-10-CM | POA: Diagnosis not present

## 2023-02-11 DIAGNOSIS — R0789 Other chest pain: Secondary | ICD-10-CM | POA: Diagnosis not present

## 2023-02-11 DIAGNOSIS — R0609 Other forms of dyspnea: Secondary | ICD-10-CM

## 2023-02-11 MED ORDER — METOPROLOL SUCCINATE ER 25 MG PO TB24: 25.0000 mg | ORAL_TABLET | Freq: Every day | ORAL | 3 refills | Status: DC

## 2023-02-11 NOTE — Patient Instructions (Signed)
Medication Instructions:   START: Metoprolol Succinate 25mg  1 tablet daily   Lab Work: None Ordered If you have labs (blood work) drawn today and your tests are completely normal, you will receive your results only by: MyChart Message (if you have MyChart) OR A paper copy in the mail If you have any lab test that is abnormal or we need to change your treatment, we will call you to review the results.   Testing/Procedures: None Ordered   Follow-Up: At [facility], you and your health needs are our priority.  As part of our continuing mission to provide you with exceptional heart care, we have created designated Provider Care Teams.  These Care Teams include your primary Cardiologist (physician) and Advanced Practice Providers (APPs -  Physician Assistants and Nurse Practitioners) who all work together to provide you with the care you need, when you need it.  We recommend signing up for the patient portal called "MyChart".  Sign up information is provided on this After Visit Summary.  MyChart is used to connect with patients for Virtual Visits (Telemedicine).  Patients are able to view lab/test results, encounter notes, upcoming appointments, etc.  Non-urgent messages can be sent to your provider as well.   To learn more about what you can do with MyChart, go to [email or web address].    Your next appointment:   5 month(s)  The format for your next appointment:   In Person  Provider:   Gypsy Balsam, MD    Other Instructions NA

## 2023-02-11 NOTE — Progress Notes (Signed)
Cardiology Office Note:    Date:  02/11/2023   ID:  Nicole Aguilar, DOB Sep 13, 1960, MRN [id number]  PCP:  Marylen Ponto, MD  Cardiologist:  Gypsy Balsam, MD    Referring MD: Marylen Ponto, MD   Chief Complaint  Patient presents with   Results    History of Present Illness:    Nicole Aguilar is a 62 y.o. female past medical history significant for lymphoma, palpitation, [place] [facility], she was referred to [place] because of dyspnea on exertion.  Evaluation included echocardiogram which was done by her pulmonologist present for ejection fraction, she did have coronary CT angio performed which showed only minimal disease 0 to 25% with low calcium score.  She comes today to discuss this.  Additionally she was palpitations.  She wore monitor which shows some supraventricular tachycardia.  Interestingly she pressed the button 8 times complaining of having episode of palpitations during those episodes rhythm was normal with slight sinus tachycardia.  Past Medical History:  Diagnosis Date   Abdominal pain 10/24/2011   Abnormal cervical Papanicolaou smear 07/22/2020   Abnormal Pap smear, high grade squamous intraepithelial lesion (HGSIL) 04/12/2011   Anemia    Chronic diarrhea 12/23/2015   Chronic headaches 04/30/2006   Chronic interstitial cystitis 04/18/2011   Chronic migraine without aura without status migrainosus, not intractable 10/08/2011   Colon polyp 07/17/2022   Dysrhythmia 04/30/2010   Holter monitor showed occ atrial flutter- no meds prescribed   Dysuria 10/24/2011   Early satiety 12/23/2015   FH: breast cancer in first degree relative 11/21/2021   Fibromyalgia 04/30/2002   Dr. 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 Logan Bores   Irritable bowel syndrome 10/24/2011   Lymphoma (HCC) 04/30/2008   grade1 follicular.  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   Right lower quadrant abdominal pain 07/17/2022   Sacroiliitis (HCC) 03/04/2015   Sinusitis 02/11/2013   Vaginitis 02/11/2013   Vitamin D deficiency 07/17/2022   last checked 11/2014    Past Surgical History:  Procedure Laterality Date   CERVICAL CONIZATION W/BX  10/19/2011   Procedure: CONIZATION CERVIX WITH BIOPSY;  Surgeon: Hal Morales, MD;  Location: [facility];  Service: Gynecology;  Laterality: N/A;  Cold Knife Conization   [facility]     COLONOSCOPY     DILATION AND CURETTAGE OF UTERUS  2011   PORTACATH PLACEMENT     used for chemo.Marland KitchenMarland KitchenNOT for IV access    Current Medications: Current Meds  Medication Sig   albuterol (VENTOLIN HFA) 108 (90 Base) MCG/ACT inhaler Inhale 2 puffs into the lungs every 6 (six) hours as needed for wheezing or shortness of breath.   benzonatate (TESSALON) 200 MG capsule Take 200 mg by mouth 3 (three) times daily as needed for cough.   estradiol-norethindrone (COMBIPATCH) 0.05-0.14 MG/DAY Place 1 patch onto the skin 2 (two) times a week.   fluticasone (FLONASE) 50 MCG/ACT nasal spray Place 1 spray into both nostrils 2 (two) times daily.  Fluticasone-Umeclidin-Vilant (TRELEGY ELLIPTA) 200-62.5-25 MCG/ACT AEPB Inhale 1 puff into the lungs daily.   LORazepam (ATIVAN) 1 MG tablet Take 1 mg by mouth daily as needed for anxiety. For upset stomach if other medications ineffective   omeprazole (PRILOSEC) 40 MG capsule Take 40 mg by mouth daily.   oxybutynin (OXYTROL) 3.9 MG/24HR Place 1 patch onto the skin every 3 (three) days.   pentosan  polysulfate (ELMIRON) 100 MG capsule Take 200 mg by mouth 2 (two) times daily.    Rimegepant Sulfate (NURTEC) 75 MG TBDP Place 75 mg under the tongue daily.   traZODone (DESYREL) 50 MG tablet Take 25 mg by mouth at bedtime.   [DISCONTINUED] metoprolol tartrate (LOPRESSOR) 50 MG tablet Take one tablet 2 hours before cardiac CT for heart greater than 55     Allergies:   Shellfish allergy   Social History   Socioeconomic History   Marital status: Married    Spouse name: Not on file   Number of children: Not on file   Years of education: Not on file   Highest education level: Not on file  Occupational History   Not on file  Tobacco Use   Smoking status: Never   Smokeless tobacco: Never  Substance and Sexual Activity   Alcohol use: No   Drug use: No   Sexual activity: Yes    Birth control/protection: Patch  Other Topics Concern   Not on file  Social History Narrative   Not on file   Social Determinants of Health   Financial Resource Strain: Not on file  Food Insecurity: Not on file  Transportation Needs: Not on file  Physical Activity: Not on file  Stress: Not on file  Social Connections: Not on file     Family History: The patient's family history includes Alzheimer's disease in her father and paternal aunt; Asthma in her mother; Cancer in her maternal grandmother and paternal aunt; Diabetes in her maternal uncle; Heart disease in her father, maternal grandfather, maternal grandmother, and paternal grandmother; Stroke in her father. ROS:   Please see the history of present illness.    All 14 point review of systems negative except as described per history of present illness  EKGs/Labs/Other Studies Reviewed:         Recent Labs: No results found for requested labs within last 365 days.  Recent Lipid Panel No results found for: "CHOL", "TRIG", "HDL", "CHOLHDL", "VLDL", "LDLCALC", "LDLDIRECT"  Physical Exam:    VS:  BP (!) 142/90 (BP Location: Left Arm, Patient  Position: Sitting)   Pulse 77   Ht 5\' 1"  (1.549 m)   Wt 159 lb 12.8 oz (72.5 kg)   LMP 02/28/2010   SpO2 94%   BMI 30.19 kg/m     Wt Readings from Last 3 Encounters:  02/11/23 159 lb 12.8 oz (72.5 kg)  08/15/22 159 lb 12.8 oz (72.5 kg)  07/04/22 161 lb (73 kg)     GEN:  Well nourished, well developed in no acute distress HEENT: Normal NECK: No JVD; No carotid bruits LYMPHATICS: No lymphadenopathy CARDIAC: RRR, no murmurs, no rubs, no gallops RESPIRATORY:  Clear to auscultation without rales, wheezing or rhonchi  ABDOMEN: Soft, non-tender, non-distended MUSCULOSKELETAL:  No edema; No deformity  SKIN: Warm and dry LOWER EXTREMITIES: no swelling NEUROLOGIC:  Alert and oriented x 3 PSYCHIATRIC:  Normal affect   ASSESSMENT:    1. Coronary artery disease involving native coronary artery of native heart without angina pectoris   2. Atypical chest  pain   3. Dyspnea on exertion    PLAN:    In order of problems listed above:  Coronary artery disease only minimal.  I will call primary care physician to get a fasting repeat profile to see if there is any role for potential statin I will calculate her 10 years risk. Atypical chest pain denies having any.  No justification based on coronary CT angio showed only minimal disease. Palpitations I will give her a very small dose of beta-blocker only 25 mg metoprolol succinate to see if that helps with her symptomatology however supraventricular tachycardia she was noted on the monitor was completely asymptomatic when she mark a trigger event in the monitor monitor demonstrated only sinus tachycardia.  Hopefully small dose of beta-blocker will help. Dyslipidemia will call primary care physician to get full fasting lipid profile to decide about therapy   Medication Adjustments/Labs and Tests Ordered: Current medicines are reviewed at length with the patient today.  Concerns regarding medicines are outlined above.  No orders of the defined  types were placed in this encounter.  Medication changes: No orders of the defined types were placed in this encounter.   Signed, Georgeanna Lea, MD, [facility] 02/11/2023 4:40 PM    [facility]

## 2023-04-18 ENCOUNTER — Ambulatory Visit (INDEPENDENT_AMBULATORY_CARE_PROVIDER_SITE_OTHER): Admitting: Allergy and Immunology

## 2023-04-18 ENCOUNTER — Encounter: Payer: Self-pay | Admitting: Allergy and Immunology

## 2023-04-18 VITALS — BP 140/88 | HR 68 | Resp 14 | Ht 60.2 in | Wt 155.6 lb

## 2023-04-18 DIAGNOSIS — G43D1 Abdominal migraine, intractable: Secondary | ICD-10-CM | POA: Diagnosis not present

## 2023-04-18 DIAGNOSIS — T451X5A Adverse effect of antineoplastic and immunosuppressive drugs, initial encounter: Secondary | ICD-10-CM

## 2023-04-18 DIAGNOSIS — J4551 Severe persistent asthma with (acute) exacerbation: Secondary | ICD-10-CM

## 2023-04-18 DIAGNOSIS — R197 Diarrhea, unspecified: Secondary | ICD-10-CM

## 2023-04-18 DIAGNOSIS — D801 Nonfamilial hypogammaglobulinemia: Secondary | ICD-10-CM

## 2023-04-18 DIAGNOSIS — J4550 Severe persistent asthma, uncomplicated: Secondary | ICD-10-CM | POA: Diagnosis not present

## 2023-04-18 DIAGNOSIS — R1084 Generalized abdominal pain: Secondary | ICD-10-CM | POA: Diagnosis not present

## 2023-04-18 DIAGNOSIS — T17500A Unspecified foreign body in bronchus causing asphyxiation, initial encounter: Secondary | ICD-10-CM

## 2023-04-18 DIAGNOSIS — T781XXA Other adverse food reactions, not elsewhere classified, initial encounter: Secondary | ICD-10-CM

## 2023-04-18 DIAGNOSIS — J3089 Other allergic rhinitis: Secondary | ICD-10-CM

## 2023-04-18 DIAGNOSIS — Z888 Allergy status to other drugs, medicaments and biological substances status: Secondary | ICD-10-CM

## 2023-04-18 DIAGNOSIS — T7819XA Other adverse food reactions, not elsewhere classified, initial encounter: Secondary | ICD-10-CM

## 2023-04-18 DIAGNOSIS — R110 Nausea: Secondary | ICD-10-CM

## 2023-04-18 MED ORDER — AIRSUPRA 90-80 MCG/ACT IN AERO: 2.0000 | INHALATION_SPRAY | RESPIRATORY_TRACT | 1 refills | Status: AC | PRN

## 2023-04-18 NOTE — Progress Notes (Signed)
[place] - [place] - [place] - [place] - [place]   Dear Dr. Vinson Moselle,  Thank you for referring Nicole Aguilar to the [facility] [place] on 04/18/2023.   Below is a summation of this patient's evaluation and recommendations.  Thank you for your referral. I will keep you informed about this patient's response to treatment.   If you have any questions please do not hesitate to contact me.   Sincerely,  Jessica Priest, MD Allergy / Immunology [facility]   ______________________________________________________________________    NEW PATIENT NOTE  Referring Provider: Raechel Ache., DO Primary Provider: Marylen Ponto, MD Date of office visit: 04/18/2023    Subjective:   Chief Complaint:  Nicole Aguilar (DOB: [birth year]) is a 62 y.o. female who presents to the clinic on 04/18/2023 with a chief complaint of Abdominal Pain .     HPI: Nicole Aguilar presents to this clinic in evaluation of several issues.  First, she has had longstanding abdominal issues for 10 years.  She relates a history of having recurrent cramps and pain in her abdomen and nausea that can awaken her from sleep and can last hours to all day.  She usually ends these "attacks" with diarrhea where she uses the bathroom about 6 times in a few hours.  This is explosive diarrhea.  Then she feels a little bit better.  This appears to occur 1 or 2 times per week.  There is not really any obvious provoking factor giving rise to this issue.  She has apparently had extensive evaluation at [facility] and locally for this issue and other than having colonic polyps there does not appear to be any obvious abnormality giving rise to this issue.  Second, without doubt, she has headaches associated with these "abdominal attacks".  She never really knows which comes first; the headache or the abdominal attack, but they definitely  coincide together.  She has rather significant migraines that give rise to "blindness" and dizziness with these attacks even while presently using Botox.  Botox does appear to decrease the intensity of these headaches but she still has headaches.  Third, she does take omeprazole for reflux which he uses every night.  This does help some of her regurgitation and burning in her chest.  She does drink 4 coffees per day.  Fourth, she has had some type of respiratory tract issue for which she is seeing a local pulmonologist.  She had a lots of coughing and shortness of breath and she has been placed on Trelegy.  She states that her oxygen drops whenever she performs the 6-minute walk.  She will occasionally have some spells of cough and throat clearing but no other significant respiratory tract symptoms.  At 1 point in time she was making "chunks" of material from her chest and she underwent a bronchoscopy and was told there was a virus in her airway.  Fourth, she does have issues with nasal congestion and sneezing.  This appears to occur during the spring and fall.  She will take some Zyrtec which helps.  She takes some nasal saline on a pretty common basis which helps with her upper airways.  Fifth, she apparently has had some type of reaction to shellfish consumption that put her in the hospital.  She does not have an EpiPen at this point.  She has a history of follicular lymphoma and was treated with rituximab 2012-2013 and deemed  to be in complete remission at this point without treatment.  Past Medical History:  Diagnosis Date   Abdominal pain 10/24/2011   Abnormal cervical Papanicolaou smear 07/22/2020   Abnormal Pap smear, high grade squamous intraepithelial lesion (HGSIL) 04/12/2011   Anemia    Asthma    Chronic diarrhea 12/23/2015   Chronic headaches 04/30/2006   Chronic interstitial cystitis 04/18/2011   Chronic migraine without aura without status migrainosus, not intractable 10/08/2011    Colon polyp 07/17/2022   COPD (chronic obstructive pulmonary disease) (HCC)    Dysrhythmia 04/30/2010   Holter monitor showed occ atrial flutter- no meds prescribed   Dysuria 10/24/2011   Early satiety 12/23/2015   FH: breast cancer in first degree relative 11/21/2021   Fibromyalgia 04/30/2002   Dr. 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 Logan Bores   Irritable bowel syndrome 10/24/2011   Lymphoma (HCC) 04/30/2008   grade1 follicular.  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   Right lower quadrant abdominal pain 07/17/2022   Sacroiliitis (HCC) 03/04/2015   Sinusitis 02/11/2013   Vaginitis 02/11/2013   Vitamin D deficiency 07/17/2022   last checked 11/2014    Past Surgical History:  Procedure Laterality Date   CERVICAL CONIZATION W/BX  10/19/2011   Procedure: CONIZATION CERVIX WITH BIOPSY;  Surgeon: Hal Morales, MD;  Location: [facility];  Service: Gynecology;  Laterality: N/A;  Cold Knife Conization   [facility]     COLONOSCOPY     DILATION AND CURETTAGE OF UTERUS  2011   PORTACATH PLACEMENT     used for chemo.Marland KitchenMarland KitchenNOT for IV access    Allergies as of 04/18/2023       Reactions   Shellfish Allergy Palpitations   Can use Betadine without difficulty        Medication List    albuterol 108 (90 Base) MCG/ACT inhaler Commonly known as: VENTOLIN HFA Inhale 2 puffs into the lungs every 6 (six) hours as needed for wheezing or shortness of breath.    benzonatate 200 MG capsule Commonly known as: TESSALON Take 200 mg by mouth 3 (three) times daily as needed for cough.   BONE ESSENTIALS PO Take by mouth.   cetirizine 10 MG tablet Commonly known as: ZYRTEC Take 10 mg by mouth daily as needed.   cholecalciferol 25 MCG (1000 UNIT) tablet Commonly known as: VITAMIN D3 Take 1,000 Units by mouth daily.   cyclobenzaprine 10 MG tablet Commonly known as: FLEXERIL Take 10 mg by mouth 3 (three) times daily as needed.   estradiol-norethindrone 0.05-0.14 MG/DAY Commonly known as: COMBIPATCH Place 1 patch onto the skin 2 (two) times a week.   fluticasone 50 MCG/ACT nasal spray Commonly known as: FLONASE Place 1 spray into both nostrils 2 (two) times daily.   LORazepam 1 MG tablet Commonly known as: ATIVAN Take 1 mg by mouth daily as needed for anxiety. For upset stomach if other medications ineffective   metoprolol succinate 25 MG 24 hr tablet Commonly known as: Toprol XL  Take 1 tablet (25 mg total) by mouth daily.   Nurtec 75 MG Tbdp Generic drug: Rimegepant Sulfate Place 75 mg under the tongue daily.   omeprazole 40 MG capsule Commonly known as: PRILOSEC Take 40 mg by mouth daily.   oxybutynin 3.9 MG/24HR Commonly known as: OXYTROL Place 1 patch onto the skin every 3 (three) days.   pentosan polysulfate 100 MG capsule Commonly known as: ELMIRON Take 200 mg by mouth 2 (two) times daily.   traZODone 50 MG tablet Commonly known as: DESYREL Take 25 mg by mouth at bedtime.   Trelegy Ellipta 200-62.5-25 MCG/ACT Aepb Generic drug: Fluticasone-Umeclidin-Vilant Inhale 1 puff into the lungs daily.    Review of systems negative except as noted in HPI / PMHx or noted below:  Review of Systems  Constitutional: Negative.   HENT: Negative.    Eyes: Negative.   Respiratory: Negative.    Cardiovascular: Negative.   Gastrointestinal: Negative.   Genitourinary: Negative.   Musculoskeletal: Negative.   Skin: Negative.    Neurological: Negative.   Endo/Heme/Allergies: Negative.   Psychiatric/Behavioral: Negative.      Family History  Problem Relation Age of Onset   Asthma Mother    Heart attack Father    Heart disease Father    Stroke Father    Alzheimer's disease Father    Diabetes Maternal Uncle    Cancer Paternal Aunt        bone & breast   Alzheimer's disease Paternal Aunt    Heart attack Paternal Uncle    Heart disease Maternal Grandmother    Cancer Maternal Grandmother        kidney   Heart disease Maternal Grandfather    Heart attack Maternal Grandfather    Heart attack Paternal Grandmother    Heart disease Paternal Grandmother    Heart attack Paternal Grandfather     Social History   Socioeconomic History   Marital status: Married    Spouse name: Not on file   Number of children: Not on file   Years of education: Not on file   Highest education level: Not on file  Occupational History   Not on file  Tobacco Use   Smoking status: Never   Smokeless tobacco: Never  Substance and Sexual Activity   Alcohol use: No   Drug use: No   Sexual activity: Yes    Birth control/protection: Patch  Other Topics Concern   Not on file  Social History Narrative   Not on file   Environmental and Social history  Lives in a house with a dry environment, no animals located inside the household, carpet in the bedroom, plastic on the bed, no plastic on the pillow, no smoking ongoing with inside household.  She works as a Retail banker.  Objective:   Vitals:   04/18/23 1457  BP: (!) 140/88  Pulse: 68  Resp: 14  SpO2: 98%   Height: 5' 0.2" (152.9 cm) Weight: 155 lb 9.6 oz (70.6 kg)  Physical Exam Constitutional:      Appearance: She is not diaphoretic.  HENT:     Head: Normocephalic.     Right Ear: Tympanic membrane, ear canal and external ear normal.     Left Ear: Tympanic membrane, ear canal and external ear normal.     Nose: Nose normal. No mucosal edema or rhinorrhea.      Mouth/Throat:     Pharynx: Uvula midline. No oropharyngeal exudate.  Eyes:     Conjunctiva/sclera: Conjunctivae normal.  Neck:  Thyroid: No thyromegaly.     Trachea: Trachea normal. No tracheal tenderness or tracheal deviation.  Cardiovascular:     Rate and Rhythm: Normal rate and regular rhythm.     Heart sounds: Normal heart sounds, S1 normal and S2 normal. No murmur heard. Pulmonary:     Effort: No respiratory distress.     Breath sounds: Normal breath sounds. No stridor. No wheezing or rales.  Lymphadenopathy:     Head:     Right side of head: No tonsillar adenopathy.     Left side of head: No tonsillar adenopathy.     Cervical: No cervical adenopathy.  Skin:    Findings: No erythema or rash.     Nails: There is no clubbing.  Neurological:     Mental Status: She is alert.     Diagnostics: Allergy skin tests were performed.  She did not demonstrate any hypersensitivity against a screening panel of aeroallergens or food.  Spirometry was performed and demonstrated an FEV1 of 0.97 @ 45 % of predicted. FEV1/FVC = 0.41    Results of a chest CT scan obtained 27 April 2022 identified the following:  .  Thoracic inlet/axilla: Tiny thyroid nodules. No axillary adenopathy.  .  Central airways: Within normal limits.  .  Heart/Vessels: Borderline cardiomegaly. No pericardial effusion. Borderline enlarged pulmonary trunk which can be seen in pulmonary arterial hypertension. No central pulmonary embolism.  .  Mediastinum: No mediastinal adenopathy.  .  Lungs: Opacification of medial right middle distal airways with distal atelectasis.  .  Pleura: Within normal limits.   Results of a chest CT scan obtained 23 August 2022 identifies minor aortic atherosclerosis, no mediastinal adenopathy, no focal airspace disease, no pulmonary nodule, no pleural fluid, right lower lobe bronchial thickening with areas of mucoid impaction.  Results of a MRI brain obtained 19 August 2021 identifies the  following: Paranasal sinuses: No air-fluid levels.   Results of blood tests obtained 14 January 2023 identifies WBC 4.3, absolute eosinophil 0, absolute basophil 0, absolute lymphocyte 1400, hemoglobin 13.0, platelet 152.  Results of blood tests obtained 18 April 2023 identifies IgG 430 Mg/DL, IgM 63 Mg/DL, IgA less than 10 Mg/DL, IgE less than 2 KU/L, negative alpha gal panel, negative shellfish panel, C4 19 Mg/DL, tryptase 4.0 UG/L.  Assessment and Plan:    1. Intractable abdominal migraine   2. Adverse food reaction, initial encounter   3. Generalized abdominal pain   4. Diarrhea, unspecified type   5. Nausea   6. Hypogammaglobulinemia (HCC)   7. Adverse reaction to rituximab infusion   8. Not well controlled severe persistent asthma   9. Mucoid impaction of bronchi    1. Perform a 4 week dairy free diet  2. Consolidate caffeine SLOWLY. Aim for no caffeine in 4 weeks  4. Treat and prevent inflammation of airway:   A. Trelegy 200 - 1 inhalation 1 time per day  B. OTC Nasacort - 1 spray each nostril 1 time per day  C. Tezepelumab???  5. If needed:   A. Airsupra - 2 inhalations every 6 hours  B. Cetirizine 10 mg - 1 tablet 1 time per day  6. Blood - Pneumo 23 serotype antibody assay, antitetanus antibody, memory B-cell panel  7. Return to clinic in 4 weeks or earlier if problem  I think that Rhyley probably has abdominal migraine giving rise to her abdominal pain episodes as there is no doubt that these episodes occur in the context of developing a headache and  she has very severe migraines requiring Botox therapy.  She is a consumer of large amounts of caffeine and we will need to have her eliminate use of caffeine slowly as this is no doubt contributing to her migraine activity.  I am going to have her perform a 4-week dairy free diet as it is quite possible that she is having some intolerance to the consumption of lactose and we should know within a few weeks whether or  not this is going to help her abdominal issue.  And she has a few other very significant issues that are ongoing at this point in time including what appears to be some obstructive lung disease, mucoid impaction of her lung, and hypogammaglobulinemia which is most likely a long-term side effect of using rituximab back in 2012-2013.  If she does not demonstrate the ability to create antibodies directed against common antigens or she has a B-cell memory panel showing significant loss of memory B cells then she will require immunoglobulin infusion especially given the fact that she has persistent mucoid impaction of her bronchi.  We will further investigate these issues and make a decision about further evaluation and treatment based upon her response to the plan noted above.   Jessica Priest, MD Allergy / Immunology [facility] [place]

## 2023-04-18 NOTE — Patient Instructions (Addendum)
  1. Perform a 4 week dairy free diet  2. Consolidate caffeine SLOWLY. Aim for no caffeine in 4 weeks  4. Treat and prevent inflammation of airway:   A. Trelegy 200 - 1 inhalation 1 time per day  B. OTC Nasacort - 1 spray each nostril 1 time per day  C. Tezepelumab???  5. If needed:   A. Airsupra - 2 inhalations every 6 hours  B. Cetirizine 10 mg - 1 tablet 1 time per day  6. Blood - Pneumo 23 serotype antibody assay, antitetanus antibody, memory B-cell panel  7. Return to clinic in 4 weeks or earlier if problem

## 2023-04-21 LAB — ALLERGEN PROFILE, SHELLFISH

## 2023-04-21 LAB — ALPHA-GAL PANEL

## 2023-04-21 LAB — CELIAC PANEL 10
Antigliadin Abs, IgA: 2 U (ref 0–19)
Gliadin IgG: 2 U (ref 0–19)
IgA/Immunoglobulin A, Serum: 10 mg/dL — ABNORMAL LOW (ref 87–352)

## 2023-04-21 LAB — TRYPTASE: Tryptase: 4 ug/L (ref 2.2–13.2)

## 2023-04-21 LAB — C4 COMPLEMENT: Complement C4, Serum: 19 mg/dL (ref 12–38)

## 2023-04-21 LAB — IGG, IGA, IGM
IgG (Immunoglobin G), Serum: 430 mg/dL — ABNORMAL LOW (ref 586–1602)
IgM (Immunoglobulin M), Srm: 63 mg/dL (ref 26–217)

## 2023-05-08 ENCOUNTER — Other Ambulatory Visit: Payer: Self-pay | Admitting: Allergy and Immunology

## 2023-05-08 DIAGNOSIS — D801 Nonfamilial hypogammaglobulinemia: Secondary | ICD-10-CM

## 2023-05-15 ENCOUNTER — Other Ambulatory Visit: Payer: Self-pay | Admitting: Allergy and Immunology

## 2023-05-20 ENCOUNTER — Ambulatory Visit (INDEPENDENT_AMBULATORY_CARE_PROVIDER_SITE_OTHER): Admitting: Allergy and Immunology

## 2023-05-20 VITALS — BP 124/78 | HR 63 | Resp 16

## 2023-05-20 DIAGNOSIS — D801 Nonfamilial hypogammaglobulinemia: Secondary | ICD-10-CM

## 2023-05-20 DIAGNOSIS — G43D1 Abdominal migraine, intractable: Secondary | ICD-10-CM

## 2023-05-20 DIAGNOSIS — Z888 Allergy status to other drugs, medicaments and biological substances status: Secondary | ICD-10-CM

## 2023-05-20 DIAGNOSIS — T781XXA Other adverse food reactions, not elsewhere classified, initial encounter: Secondary | ICD-10-CM

## 2023-05-20 DIAGNOSIS — T781XXD Other adverse food reactions, not elsewhere classified, subsequent encounter: Secondary | ICD-10-CM

## 2023-05-20 DIAGNOSIS — T7819XD Other adverse food reactions, not elsewhere classified, subsequent encounter: Secondary | ICD-10-CM

## 2023-05-20 DIAGNOSIS — J4550 Severe persistent asthma, uncomplicated: Secondary | ICD-10-CM

## 2023-05-20 DIAGNOSIS — R09A2 Foreign body sensation, throat: Secondary | ICD-10-CM

## 2023-05-20 DIAGNOSIS — T17500A Unspecified foreign body in bronchus causing asphyxiation, initial encounter: Secondary | ICD-10-CM

## 2023-05-20 DIAGNOSIS — T451X5A Adverse effect of antineoplastic and immunosuppressive drugs, initial encounter: Secondary | ICD-10-CM

## 2023-05-20 NOTE — Progress Notes (Unsigned)
[place] - [place] - [place] - [place] - [place]   Follow-up Note  Referring Provider: Marylen Ponto, MD Primary Provider: Marylen Ponto, MD Date of Office Visit: 05/20/2023  Subjective:   Nicole Aguilar (DOB: May 07, 1960) is a 63 y.o. 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  I last saw her in this clinic during her initial evaluation of 18 April 2023.  We had her go on a dairy free diet and she might be a little bit better regarding her abdominal issue.  We have also had her taper down her caffeine and she is now only consuming 1 coffee per day instead of 4 coffees per day as there did appear to be some temporal relationship between headaches and her abdominal issue.  She is continuing on Botox injections every 3 weeks for her migraines.  When we last saw her in this clinic we gave her a collection of anti-inflammatory agents for both her upper and lower airway and at this point she thinks her airway is doing relatively well and she does not need to use the short acting bronchodilator.  She also has very little upper airway symptoms.  Allergies as of 05/20/2023       Reactions   Shellfish Allergy Palpitations   Can use Betadine without difficulty        Medication List    Airsupra 90-80 MCG/ACT Aero Generic drug: Albuterol-Budesonide Inhale 2 puffs into the lungs as needed (every 6 hours for cough, wheeze, shortness of breath.  Rinse, gargle and spit after use).   benzonatate 200 MG capsule Commonly known as: TESSALON Take 200 mg by mouth 3 (three) times daily as needed for cough.   BONE ESSENTIALS PO Take by mouth.   cetirizine 10 MG tablet Commonly known as: ZYRTEC Take  10 mg by mouth daily as needed.   cholecalciferol 25 MCG (1000 UNIT) tablet Commonly known as: VITAMIN D3 Take 1,000 Units by mouth daily.   cyclobenzaprine 10 MG tablet Commonly known as: FLEXERIL Take 10 mg by mouth 3 (three) times daily as needed.   estradiol-norethindrone 0.05-0.14 MG/DAY Commonly known as: COMBIPATCH Place 1 patch onto the skin 2 (two) times a week.   fluticasone 50 MCG/ACT nasal spray Commonly known as: FLONASE Place 1 spray into both nostrils 2 (two) times daily.   LORazepam 1 MG tablet Commonly known as: ATIVAN Take 1 mg by mouth daily as needed for anxiety. For upset stomach if other medications ineffective   metoprolol succinate 25 MG 24 hr tablet Commonly known as: Toprol XL Take 1 tablet (25 mg total) by mouth daily.   Nurtec 75 MG Tbdp Generic drug: Rimegepant Sulfate Place 75 mg under the tongue daily.   omeprazole 40 MG capsule Commonly known as: PRILOSEC Take 40 mg by mouth daily.   oxybutynin 3.9 MG/24HR Commonly known as: OXYTROL Place 1 patch onto the skin every 3 (three) days.   pentosan polysulfate 100 MG capsule Commonly known as: ELMIRON Take 200 mg by mouth 2 (two) times daily.   traZODone 50 MG tablet Commonly known as: DESYREL Take 25 mg by mouth at bedtime.   Trelegy Ellipta 200-62.5-25 MCG/ACT Aepb Generic drug: Fluticasone-Umeclidin-Vilant Inhale 1 puff into the lungs daily.  Past Medical History:  Diagnosis Date   Abdominal pain 10/24/2011   Abnormal cervical Papanicolaou smear 07/22/2020   Abnormal Pap smear, high grade squamous intraepithelial lesion (HGSIL) 04/12/2011   Anemia    Asthma    Chronic diarrhea 12/23/2015   Chronic headaches 04/30/2006   Chronic interstitial cystitis 04/18/2011   Chronic migraine without aura without status migrainosus, not intractable 10/08/2011   Colon polyp 07/17/2022   COPD (chronic obstructive pulmonary disease) (HCC)    Dysrhythmia 04/30/2010   Holter monitor showed  occ atrial flutter- no meds prescribed   Dysuria 10/24/2011   Early satiety 12/23/2015   FH: breast cancer in first degree relative 11/21/2021   Fibromyalgia 04/30/2002   Dr. 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 Logan Bores   Irritable bowel syndrome 10/24/2011   Lymphoma (HCC) 04/30/2008   grade1 follicular.  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   Right lower quadrant abdominal pain 07/17/2022   Sacroiliitis (HCC) 03/04/2015   Sinusitis 02/11/2013   Vaginitis 02/11/2013   Vitamin D deficiency 07/17/2022   last checked 11/2014    Past Surgical History:  Procedure Laterality Date   CERVICAL CONIZATION W/BX  10/19/2011   Procedure: CONIZATION CERVIX WITH BIOPSY;  Surgeon: Hal Morales, MD;  Location: [facility];  Service: Gynecology;  Laterality: N/A;  Cold Knife Conization   [facility]     COLONOSCOPY     DILATION AND CURETTAGE OF UTERUS  2011   PORTACATH PLACEMENT     used for chemo.Marland KitchenMarland KitchenNOT for IV access    Review of systems negative except as noted in HPI / PMHx or noted below:  Review of Systems  Constitutional: Negative.   HENT: Negative.    Eyes: Negative.   Respiratory: Negative.    Cardiovascular: Negative.   Gastrointestinal: Negative.   Genitourinary: Negative.   Musculoskeletal: Negative.   Skin: Negative.   Neurological: Negative.   Endo/Heme/Allergies: Negative.   Psychiatric/Behavioral: Negative.       Objective:   Vitals:   05/20/23  1628  BP: 124/78  Pulse: 63  Resp: 16  SpO2: 97%          Physical Exam Constitutional:      Appearance: She is not diaphoretic.  HENT:     Head: Normocephalic.     Right Ear: Tympanic membrane, ear canal and external ear normal.     Left Ear: Tympanic membrane, ear canal and external ear normal.     Nose: Nose normal. No mucosal edema or rhinorrhea.     Mouth/Throat:     Pharynx: Uvula midline. No oropharyngeal exudate.  Eyes:     Conjunctiva/sclera: Conjunctivae normal.  Neck:     Thyroid: No thyromegaly.     Trachea: Trachea normal. No tracheal tenderness or tracheal deviation.  Cardiovascular:     Rate and Rhythm: Normal rate and regular rhythm.     Heart sounds: Normal heart sounds, S1 normal and S2 normal. No murmur heard. Pulmonary:     Effort:  No respiratory distress.     Breath sounds: Normal breath sounds. No stridor. No wheezing or rales.  Lymphadenopathy:     Head:     Right side of head: No tonsillar adenopathy.     Left side of head: No tonsillar adenopathy.     Cervical: No cervical adenopathy.  Skin:    Findings: No erythema or rash.     Nails: There is no clubbing.  Neurological:     Mental Status: She is alert.     Diagnostics: Spirometry was performed and demonstrated an FEV1 of 1.69 at 79 % of predicted.  Results of blood tests obtained 18 April 2023 identifies IgG 430 Mg/DL, IgM 63 Mg/DL, IgE less than 2 KU/L, IgA less than 10 Mg/DL.  As a result of these blood test results a Pneumo 23 serotype assay and memory B-cell panel was ordered.  Assessment and Plan:   1. Hypogammaglobulinemia (HCC)   2. Intractable abdominal migraine   3. Adverse reaction to rituximab infusion   4. Mucoid impaction of bronchi   5. Not well controlled severe persistent asthma   6. Adverse food reaction, subsequent encounter    1. Dairy free diet? Reintroduce dairy: what happens???  2. Consolidate caffeine SLOWLY.   4. Treat and prevent inflammation of  airway:   A. Trelegy 200 - 1 inhalation 1 time per day  B. OTC Nasacort - 1 spray each nostril 1 time per day  5. If needed:   A. Airsupra - 2 inhalations every 6 hours  B. Cetirizine 10 mg - 1 tablet 1 time per day  6. Awaiting blood test results. Immunoglobulin infusions???  7. Influenza = Tamiflu. Covid = Paxlovid  8. 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  It did sound as though she had intractable abdominal migraines but she also had an issue with diarrhea affecting her GI tract and she has been dairy free for about a month now and now she can reintroduce dairy and see what happens regarding this exposure.  And she should continue to slowly consolidate her caffeine so that she has had 0 caffeine consumption sometime within the next month or so.  I will contact her with the results of her blood test once they are available for review.  Nicole Schimke, MD Allergy / Immunology [facility]

## 2023-05-20 NOTE — Patient Instructions (Addendum)
  1. Dairy free diet? Reintroduce dairy: what happens???  2. Consolidate caffeine SLOWLY.   4. Treat and prevent inflammation of airway:   A. Trelegy 200 - 1 inhalation 1 time per day  B. OTC Nasacort - 1 spray each nostril 1 time per day  5. If needed:   A. Airsupra - 2 inhalations every 6 hours  B. Cetirizine 10 mg - 1 tablet 1 time per day  6. Awaiting blood test results. Immunoglobulin infusions???  7. Influenza = Tamiflu. Covid = Paxlovid  8. Return to clinic in 12 weeks or earlier if problem

## 2023-05-21 ENCOUNTER — Encounter: Payer: Self-pay | Admitting: Allergy and Immunology

## 2023-05-22 LAB — STREP PNEUMONIAE 23 SEROTYPES IGG
Pneumo Ab Type 1*: <0.1 ug/mL — ABNORMAL LOW (ref >1.3)
Pneumo Ab Type 12 (12F)*: <0.1 ug/mL — ABNORMAL LOW (ref >1.3)
Pneumo Ab Type 14*: <0.1 ug/mL — ABNORMAL LOW (ref >1.3)
Pneumo Ab Type 17 (17F)*: <0.1 ug/mL — ABNORMAL LOW (ref >1.3)
Pneumo Ab Type 19 (19F)*: 0.3 ug/mL — ABNORMAL LOW (ref >1.3)
Pneumo Ab Type 2*: 0.2 ug/mL — ABNORMAL LOW (ref >1.3)
Pneumo Ab Type 20*: 0.2 ug/mL — ABNORMAL LOW (ref >1.3)
Pneumo Ab Type 22 (22F)*: <0.1 ug/mL — ABNORMAL LOW (ref >1.3)
Pneumo Ab Type 23 (23F)*: 0.5 ug/mL — ABNORMAL LOW (ref >1.3)
Pneumo Ab Type 26 (6B)*: <0.1 ug/mL — ABNORMAL LOW (ref >1.3)
Pneumo Ab Type 3*: <0.1 ug/mL — ABNORMAL LOW (ref >1.3)
Pneumo Ab Type 34 (10A)*: <0.1 ug/mL — ABNORMAL LOW (ref >1.3)
Pneumo Ab Type 4*: <0.1 ug/mL — ABNORMAL LOW (ref >1.3)
Pneumo Ab Type 43 (11A)*: <0.1 ug/mL — ABNORMAL LOW (ref >1.3)
Pneumo Ab Type 5*: <0.1 ug/mL — ABNORMAL LOW (ref >1.3)
Pneumo Ab Type 51 (7F)*: <0.1 ug/mL — ABNORMAL LOW (ref >1.3)
Pneumo Ab Type 54 (15B)*: 0.3 ug/mL — ABNORMAL LOW (ref >1.3)
Pneumo Ab Type 56 (18C)*: <0.1 ug/mL — ABNORMAL LOW (ref >1.3)
Pneumo Ab Type 57 (19A)*: 0.2 ug/mL — ABNORMAL LOW (ref >1.3)
Pneumo Ab Type 68 (9V)*: <0.1 ug/mL — ABNORMAL LOW (ref >1.3)
Pneumo Ab Type 70 (33F)*: <0.1 ug/mL — ABNORMAL LOW (ref >1.3)
Pneumo Ab Type 8*: <0.3 ug/mL — ABNORMAL LOW (ref >1.3)
Pneumo Ab Type 9 (9N)*: <0.1 ug/mL — ABNORMAL LOW (ref >1.3)

## 2023-05-22 LAB — DIPHTHERIA / TETANUS ANTIBODY PANEL
Diphtheria Ab: 0.13 [IU]/mL (ref <0.10)
Tetanus Ab, IgG: 0.58 [IU]/mL (ref <0.10)

## 2023-05-24 LAB — MEMORY B CELLS
CD27+/IgD+ (Unswitched): 0.8 % — ABNORMAL LOW (ref 3.8–52.7)
CD27+/IgD- (Switched): 0.2 % — ABNORMAL LOW (ref 1.9–30.4)
CD27-/IgD+ (Naive): 94.7 % — ABNORMAL HIGH (ref 24.4–90.6)

## 2023-05-28 ENCOUNTER — Encounter: Payer: Self-pay | Admitting: *Deleted

## 2023-06-03 ENCOUNTER — Telehealth: Payer: Self-pay | Admitting: *Deleted

## 2023-06-03 NOTE — Telephone Encounter (Signed)
Called patient and advised rX to [facility] for Cutaquig 9 g weekly

## 2023-06-03 NOTE — Telephone Encounter (Signed)
-----   Message from ERIC J KOZLOW sent at 05/29/2023  5:15 PM EST ----- Proceed. Her b cell memory panel showed no memory b cells. ----- Message ----- From: Devoria Glassing, CMA Sent: 05/29/2023   4:55 PM EST To: Jessica Priest, MD  Do we need to wait till we get the post pneumococcal results back to get approval not sure if they will or not without same

## 2023-07-08 ENCOUNTER — Other Ambulatory Visit: Payer: Self-pay

## 2023-07-08 MED ORDER — METOPROLOL SUCCINATE ER 25 MG PO TB24: 25.0000 mg | ORAL_TABLET | Freq: Every day | ORAL | 1 refills | Status: AC

## 2023-08-14 ENCOUNTER — Ambulatory Visit (INDEPENDENT_AMBULATORY_CARE_PROVIDER_SITE_OTHER): Admitting: Allergy and Immunology

## 2023-08-14 ENCOUNTER — Encounter: Payer: Self-pay | Admitting: Allergy and Immunology

## 2023-08-14 VITALS — BP 122/88 | HR 76 | Resp 12

## 2023-08-14 DIAGNOSIS — J310 Chronic rhinitis: Secondary | ICD-10-CM

## 2023-08-14 DIAGNOSIS — T451X5A Adverse effect of antineoplastic and immunosuppressive drugs, initial encounter: Secondary | ICD-10-CM

## 2023-08-14 DIAGNOSIS — J4550 Severe persistent asthma, uncomplicated: Secondary | ICD-10-CM | POA: Diagnosis not present

## 2023-08-14 DIAGNOSIS — D801 Nonfamilial hypogammaglobulinemia: Secondary | ICD-10-CM

## 2023-08-14 DIAGNOSIS — Z888 Allergy status to other drugs, medicaments and biological substances status: Secondary | ICD-10-CM | POA: Diagnosis not present

## 2023-08-14 NOTE — Progress Notes (Unsigned)
 [place] - [place] - [place] - [place] - [place]   Follow-up Note  Referring Provider: Marylen Ponto, MD Primary Provider: Marylen Ponto, MD Date of Office Visit: 08/14/2023  Subjective:   Nicole Aguilar (DOB: 02-Jan-1961) is a 63 y.o. female who [facility] in re-evaluation of the following:  HPI: Nicole Aguilar returns to this clinic in evaluation of cyclical abdominal pain and diarrhea, hypogammaglobulinemia, asthma, mucoid impaction of bronchi, history of migraine.  I last saw her in this clinic 20 May 2023.  She appears to be doing very well regarding all of her issues.  She has not had any abdominal pain or diarrhea.  She has had no significant respiratory tract symptoms requiring her to use a rescue medicine.  She has had no headaches.  She has had no infections.  She is now eating dairy with no problems.  She has started subcutaneous immunoglobulin infusion for her hypogammaglobulinemia.  She has had 5 weekly infusions  Allergies as of 08/14/2023       Reactions   Shellfish Allergy Palpitations   Can use Betadine without difficulty        Medication List    Airsupra 90-80 MCG/ACT Aero Generic drug: Albuterol-Budesonide Inhale 2 puffs into the lungs as needed (every 6 hours for cough, wheeze, shortness of breath.  Rinse, gargle and spit after use).   benzonatate 200 MG capsule Commonly known as: TESSALON Take 200 mg by mouth 3 (three) times daily as needed for cough.   cetirizine 10 MG tablet Commonly known as: ZYRTEC Take 10 mg by mouth daily as needed.   cholecalciferol 25 MCG (1000 UNIT) tablet Commonly known as: VITAMIN D3 Take 1,000 Units by mouth daily.   Cutaquig 8 GM/48ML Soln Generic drug: Immune Globulin (Human)-hipp Inject into the skin.   cyclobenzaprine 10 MG tablet Commonly known as: FLEXERIL Take 10 mg by mouth 3 (three) times daily as needed.   EPINEPHrine 0.3 mg/0.3 mL Soaj  injection Commonly known as: EPI-PEN   estradiol-norethindrone 0.05-0.14 MG/DAY Commonly known as: COMBIPATCH Place 1 patch onto the skin 2 (two) times a week.   fluticasone 50 MCG/ACT nasal spray Commonly known as: FLONASE Place 1 spray into both nostrils 2 (two) times daily.   LORazepam 1 MG tablet Commonly known as: ATIVAN Take 1 mg by mouth daily as needed for anxiety. For upset stomach if other medications ineffective   metoprolol succinate 25 MG 24 hr tablet Commonly known as: Toprol XL Take 1 tablet (25 mg total) by mouth daily.   Nurtec 75 MG Tbdp Generic drug: Rimegepant Sulfate Place 75 mg under the tongue daily.   omeprazole 40 MG capsule Commonly known as: PRILOSEC Take 40 mg by mouth daily.   oxybutynin 3.9 MG/24HR Commonly known as: OXYTROL Place 1 patch onto the skin every 3 (three) days.   pentosan polysulfate 100 MG capsule Commonly known as: ELMIRON Take 200 mg by mouth 2 (two) times daily.   Qulipta 60 MG Tabs Generic drug: Atogepant Take 1 tablet by mouth daily.   traZODone 100 MG tablet Commonly known as: DESYREL Take 100 mg by mouth at bedtime.   Trelegy Ellipta 200-62.5-25 MCG/ACT Aepb Generic drug: Fluticasone-Umeclidin-Vilant Inhale 1 puff into the lungs daily.   Wegovy 1 MG/0.5ML Soaj Generic drug: Semaglutide-Weight Management Inject into the skin once a week.    Past Medical History:  Diagnosis Date   Abdominal pain 10/24/2011   Abnormal cervical Papanicolaou smear 07/22/2020  Abnormal Pap smear, high grade squamous intraepithelial lesion (HGSIL) 04/12/2011   Anemia    Asthma    Chronic diarrhea 12/23/2015   Chronic headaches 04/30/2006   Chronic interstitial cystitis 04/18/2011   Chronic migraine without aura without status migrainosus, not intractable 10/08/2011   Colon polyp 07/17/2022   COPD (chronic obstructive pulmonary disease) (HCC)    Dysrhythmia 04/30/2010   Holter monitor showed occ atrial flutter- no meds  prescribed   Dysuria 10/24/2011   Early satiety 12/23/2015   FH: breast cancer in first degree relative 11/21/2021   Fibromyalgia 04/30/2002   Dr. 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 Logan Bores   Irritable bowel syndrome 10/24/2011   Lymphoma (HCC) 04/30/2008   grade1 follicular.  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   Right lower quadrant abdominal pain 07/17/2022   Sacroiliitis (HCC) 03/04/2015   Sinusitis 02/11/2013   Vaginitis 02/11/2013   Vitamin D deficiency 07/17/2022   last checked 11/2014    Past Surgical History:  Procedure Laterality Date   CERVICAL CONIZATION W/BX  10/19/2011   Procedure: CONIZATION CERVIX WITH BIOPSY;  Surgeon: Hal Morales, MD;  Location: [facility];  Service: Gynecology;  Laterality: N/A;  Cold Knife Conization   [facility]     COLONOSCOPY     DILATION AND CURETTAGE OF UTERUS  2011   PORTACATH PLACEMENT     used for chemo.Marland KitchenMarland KitchenNOT for IV access    Review of systems negative except as noted in HPI / PMHx or noted below:  Review of Systems  Constitutional: Negative.   HENT: Negative.    Eyes: Negative.   Respiratory: Negative.    Cardiovascular: Negative.   Gastrointestinal: Negative.   Genitourinary: Negative.   Musculoskeletal: Negative.   Skin: Negative.   Neurological: Negative.   Endo/Heme/Allergies: Negative.   Psychiatric/Behavioral: Negative.       Objective:   Vitals:   08/14/23 1641  BP: 122/88  Pulse:  76  Resp: 12          Physical Exam Constitutional:      Appearance: She is not diaphoretic.  HENT:     Head: Normocephalic.     Right Ear: Tympanic membrane, ear canal and external ear normal.     Left Ear: Tympanic membrane, ear canal and external ear normal.     Nose: Nose normal. No mucosal edema or rhinorrhea.     Mouth/Throat:     Pharynx: Uvula midline. No oropharyngeal exudate.  Eyes:     Conjunctiva/sclera: Conjunctivae normal.  Neck:     Thyroid: No thyromegaly.     Trachea: Trachea normal. No tracheal tenderness or tracheal deviation.  Cardiovascular:     Rate and Rhythm: Normal rate and regular rhythm.     Heart sounds: Normal heart sounds, S1 normal and S2 normal. No murmur heard. Pulmonary:     Effort: No respiratory distress.     Breath sounds: Normal breath sounds. No stridor. No wheezing or rales.  Lymphadenopathy:  Head:     Right side of head: No tonsillar adenopathy.     Left side of head: No tonsillar adenopathy.     Cervical: No cervical adenopathy.  Skin:    Findings: No erythema or rash.     Nails: There is no clubbing.  Neurological:     Mental Status: She is alert.     Diagnostics:    Spirometry was performed and demonstrated an FEV1 of 1.51 at 71 % of predicted.  Results of blood tests obtained 15 May 2023 identifies the absence of any CD27 positive switched and un switched B cells.  No antipneumococcal antibodies demonstrated on a Pneumo 23 serotype assay  Assessment and Plan:   1. Hypogammaglobulinemia (HCC)   2. Adverse reaction to rituximab infusion   3. Asthma, severe persistent, well-controlled   4. Chronic rhinitis    1. Continue subcutaneous immunoglobulin infusions - Cutaquig 9 gms weekly  2. Blood - IgA/G/M  3.  Continue to treat and prevent inflammation of airway:   A. Trelegy 200 - 1 inhalation 1 time per day  B. OTC Flonase - 1 spray each nostril 1 time per day  4. If needed:   A. Airsupra - 2 inhalations  every 6 hours  B. Cetirizine 10 mg - 1 tablet 1 time per day  5. Influenza = Tamiflu. Covid = Paxlovid  6. Return to clinic in 6 months or earlier if problem  We will now check Madellyn's immunoglobulin levels to see if she is adequately replaced regarding her rituximab induced hypogammaglobulinemia.  She will continue on anti-inflammatory agents for both her upper and lower airway.  I will see her back in this clinic in 6 months or earlier if there is a problem.  Schuyler Custard, MD Allergy / Immunology [facility]

## 2023-08-14 NOTE — Patient Instructions (Signed)
  1. Continue subcutaneous immunoglobulin infusions weekly  2. Blood - IgA/G/M  3.  Continue to treat and prevent inflammation of airway:   A. Trelegy 200 - 1 inhalation 1 time per day  B. OTC Flonase - 1 spray each nostril 1 time per day  4. If needed:   A. Airsupra - 2 inhalations every 6 hours  B. Cetirizine 10 mg - 1 tablet 1 time per day  5. Influenza = Tamiflu. Covid = Paxlovid  6. Return to clinic in 6 months or earlier if problem

## 2023-08-15 ENCOUNTER — Encounter: Payer: Self-pay | Admitting: Allergy and Immunology

## 2023-08-17 LAB — IGG, IGA, IGM
IgA/Immunoglobulin A, Serum: 8 mg/dL — ABNORMAL LOW (ref 87–352)
IgG (Immunoglobin G), Serum: 723 mg/dL (ref 586–1602)
IgM (Immunoglobulin M), Srm: 47 mg/dL (ref 26–217)

## 2023-08-20 ENCOUNTER — Telehealth: Payer: Self-pay | Admitting: *Deleted

## 2023-08-20 NOTE — Telephone Encounter (Signed)
 Called patient and advised increase in Cutaquig dosing to 12 grams weekly will need to get IgG rechecked after 8 weeks on new dosing. Order sent to [facility]

## 2023-08-20 NOTE — Telephone Encounter (Signed)
-----   Message from ERIC J KOZLOW sent at 08/19/2023  7:30 AM EDT ----- Please inform Nicole Aguilar that her IgG level although improved from [phone] is still a little bit low.  Lets try to increase Cutaquig to 12 g weekly and repeat IgG 8 weeks after increasing dose.

## 2023-10-31 ENCOUNTER — Telehealth: Payer: Self-pay | Admitting: *Deleted

## 2023-10-31 DIAGNOSIS — D830 Common variable immunodeficiency with predominant abnormalities of B-cell numbers and function: Secondary | ICD-10-CM

## 2023-10-31 NOTE — Telephone Encounter (Signed)
 Per fax from [facility] patient having issues with SCIG and site reactions and wants to switch to IVIG. Looking back in chart we have not checked her immunoglobulins since increase in April. Advised patient to get labs and then we can decide if we need to increase dose again when we change her over to IVIG. She is good with this plan

## 2023-11-05 LAB — IGG, IGA, IGM
IgA/Immunoglobulin A, Serum: 8 mg/dL — ABNORMAL LOW (ref 87–352)
IgG (Immunoglobin G), Serum: 661 mg/dL (ref 586–1602)
IgM (Immunoglobulin M), Srm: 49 mg/dL (ref 26–217)

## 2023-11-14 ENCOUNTER — Ambulatory Visit: Payer: Self-pay | Admitting: Allergy and Immunology

## 2023-11-15 NOTE — Progress Notes (Signed)
 Spoke to patient and advised will reach out to [facility] about doing her infusions IVIG in home

## 2023-11-18 NOTE — Progress Notes (Signed)
 Called [facility] and advised change to IVIG and they will investigate which products are on [employer] and fax new orders

## 2023-12-03 ENCOUNTER — Telehealth: Payer: Self-pay | Admitting: *Deleted

## 2023-12-03 NOTE — Telephone Encounter (Signed)
 Patient called to check status of changing to IVIG. I call [facility] and was advised they are still working on approval which has been submitted. Patient advised

## 2024-02-10 ENCOUNTER — Encounter: Payer: Self-pay | Admitting: Allergy and Immunology

## 2024-02-10 ENCOUNTER — Ambulatory Visit: Admitting: Allergy and Immunology

## 2024-02-10 VITALS — BP 122/78 | HR 76 | Resp 16

## 2024-02-10 DIAGNOSIS — J4550 Severe persistent asthma, uncomplicated: Secondary | ICD-10-CM | POA: Diagnosis not present

## 2024-02-10 DIAGNOSIS — D830 Common variable immunodeficiency with predominant abnormalities of B-cell numbers and function: Secondary | ICD-10-CM | POA: Diagnosis not present

## 2024-02-10 NOTE — Patient Instructions (Addendum)
  1. Continue IVIG every 4 weeks (Gamunex-C 50 g)  2.  Continue to treat and prevent inflammation of airway:   A. Trelegy 200 - 1 inhalation 1 time per day  B. OTC Flonase - 1 spray each nostril 1 time per day  3. If needed:   A. Airsupra  - 2 inhalations every 6 hours  B. Cetirizine 10 mg - 1 tablet 1 time per day  4. Have nurse check IgA/G/M prior to 4th IV infusion  5. Influenza = Tamiflu. Covid = Paxlovid  6. Return to clinic in 6 months or earlier if problem

## 2024-02-10 NOTE — Progress Notes (Unsigned)
 [place] [facility] [place] [facility] [place] [facility] [place] [facility] [place]   Follow-up Note  Referring Provider: Ina Marcellus RAMAN, MD Primary Provider: Ina Marcellus RAMAN, MD Date of Office Visit: 02/10/2024  Subjective:   Nicole Aguilar (DOB: [birth year]) is a 63 y.o. 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  I last saw her in this clinic 14 August 2023.  We were treating her initially with subcutaneous immunoglobulin but unfortunately she was developing subcutaneous nodules that injection sites and we switched her to IVIG and she has had 2 infusions of IVIG to date.  She has not had any problems with infections since I have last seen her in this clinic.  She has not had any problems with her airway and does not need to use any Airsupra  and can exert herself with no problem.  She has not required a systemic steroid or an antibiotic for any type of airway issue.  She continues on Trelegy every day and Flonase every day.  Allergies as of 02/10/2024       Reactions   Shellfish Allergy  Palpitations   Can use Betadine without difficulty        Medication List    Airsupra  90-80 MCG/ACT Aero Generic drug: Albuterol-Budesonide Inhale 2 puffs into the lungs as needed (every 6 hours for cough, wheeze, shortness of breath.  Rinse, gargle and spit after use).   benzonatate 200 MG capsule Commonly known as: TESSALON Take 200 mg by mouth 3 (three) times daily as needed for cough.   cetirizine 10 MG tablet Commonly known as: ZYRTEC Take 10 mg by mouth daily as needed.   cholecalciferol 25 MCG (1000 UNIT) tablet Commonly known as: VITAMIN D3 Take 1,000 Units by mouth daily.   Cutaquig 8 GM/48ML Soln Generic drug: Immune Globulin (Human)-hipp Inject into the skin.   cyclobenzaprine 10 MG tablet Commonly known as:  FLEXERIL Take 10 mg by mouth 3 (three) times daily as needed.   EPINEPHrine 0.3 mg/0.3 mL Soaj injection Commonly known as: EPI-PEN   estradiol -norethindrone  0.05-0.14 MG/DAY Commonly known as: COMBIPATCH Place 1 patch onto the skin 2 (two) times a week.   fluticasone 50 MCG/ACT nasal spray Commonly known as: FLONASE Place 1 spray into both nostrils 2 (two) times daily.   LORazepam 1 MG tablet Commonly known as: ATIVAN Take 1 mg by mouth daily as needed for anxiety. For upset stomach if other medications ineffective   metoprolol  succinate 25 MG 24 hr tablet Commonly known as: Toprol  XL Take 1 tablet (25 mg total) by mouth daily.   Nurtec 75 MG Tbdp Generic drug: Rimegepant Sulfate Place 75 mg under the tongue daily.   omeprazole 40 MG capsule Commonly known as: PRILOSEC Take 40 mg by mouth daily.   oxybutynin 3.9 MG/24HR Commonly known as: OXYTROL Place 1 patch onto the skin every 3 (three) days.   pentosan polysulfate 100 MG capsule Commonly known as: ELMIRON Take 200 mg by mouth 2 (two) times daily.   Qulipta 60 MG Tabs Generic drug: Atogepant Take 1 tablet by mouth daily.   traZODone 100 MG tablet Commonly known as: DESYREL Take 100 mg by mouth at bedtime.   Trelegy Ellipta 200-62.5-25 MCG/ACT Aepb Generic drug: Fluticasone-Umeclidin-Vilant Inhale 1 puff into the lungs daily.   Wegovy 1 MG/0.5ML Soaj SQ injection Generic drug: semaglutide-weight management Inject into the  skin once a week.    Past Medical History:  Diagnosis Date   Abdominal pain 10/24/2011   Abnormal cervical Papanicolaou smear 07/22/2020   Abnormal Pap smear, high grade squamous intraepithelial lesion (HGSIL) 04/12/2011   Anemia    Asthma    Chronic diarrhea 12/23/2015   Chronic headaches 04/30/2006   Chronic interstitial cystitis 04/18/2011   Chronic migraine without aura without status migrainosus, not intractable 10/08/2011   Colon polyp 07/17/2022   COPD (chronic obstructive  pulmonary disease) (HCC)    Dysrhythmia 04/30/2010   Holter monitor showed occ atrial flutter- no meds prescribed   Dysuria 10/24/2011   Early satiety 12/23/2015   FH: breast cancer in first degree relative 11/21/2021   Fibromyalgia 04/30/2002   Dr. 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   Right lower quadrant abdominal pain 07/17/2022   Sacroiliitis 03/04/2015   Sinusitis 02/11/2013   Vaginitis 02/11/2013   Vitamin D deficiency 07/17/2022   last checked 11/2014    Past Surgical History:  Procedure Laterality Date   CERVICAL CONIZATION W/BX  10/19/2011   Procedure: CONIZATION CERVIX WITH BIOPSY;  Surgeon: [name], MD;  Location: [facility];  Service: Gynecology;  Laterality: N/A;  Cold Knife Conization   [facility]     COLONOSCOPY     DILATION AND CURETTAGE OF UTERUS  2011   PORTACATH PLACEMENT     used for chemo.SABRASABRANOT for IV access    Review of systems negative except as noted in HPI / PMHx or noted below:  Review of Systems  Constitutional: Negative.   HENT: Negative.    Eyes: Negative.   Respiratory: Negative.    Cardiovascular: Negative.   Gastrointestinal: Negative.   Genitourinary: Negative.   Musculoskeletal: Negative.   Skin: Negative.   Neurological: Negative.   Endo/Heme/Allergies: Negative.    Psychiatric/Behavioral: Negative.       Objective:   Vitals:   02/10/24 1723  BP: 122/78  Pulse: 76  Resp: 16  SpO2: 96%          Physical Exam Constitutional:      Appearance: She is not diaphoretic.  HENT:     Head: Normocephalic.     Right Ear: Tympanic membrane, ear canal and external ear normal.     Left Ear: Tympanic membrane, ear canal and external ear normal.     Nose: Nose normal. No mucosal edema or rhinorrhea.     Mouth/Throat:     Pharynx: Uvula midline. No oropharyngeal exudate.  Eyes:     Conjunctiva/sclera: Conjunctivae normal.  Neck:     Thyroid: No thyromegaly.     Trachea: Trachea normal. No tracheal tenderness or tracheal deviation.  Cardiovascular:     Rate and Rhythm: Normal rate and regular rhythm.     Heart sounds: Normal heart sounds, S1 normal and S2 normal. No murmur heard.  Pulmonary:     Effort: No respiratory distress.     Breath sounds: Normal breath sounds. No stridor. No wheezing or rales.  Lymphadenopathy:     Head:     Right side of head: No tonsillar adenopathy.     Left side of head: No tonsillar adenopathy.     Cervical: No cervical adenopathy.  Skin:    Findings: No erythema or rash.     Nails: There is no clubbing.  Neurological:     Mental Status: She is alert.     Diagnostics:    Spirometry was performed and demonstrated an FEV1 of 1.59 at 75 % of predicted.  Results of blood tests obtained 04 November 2023 identifies IgG 661 MGs/DL, IgA 8 mg/DL, IgM 49 mg/DL  Assessment and Plan:   1. 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 Asthma, severe persistent, well-controlled (HCC)    1. Continue IVIG every 4 weeks (Gamunex-C 50 g)  2.  Continue to treat and prevent inflammation of airway:   A. Trelegy 200 - 1 inhalation 1 time per day  B. OTC Flonase - 1 spray each nostril 1 time per day  3. If needed:   A. Airsupra  - 2 inhalations every 6 hours  B.  Cetirizine 10 mg - 1 tablet 1 time per day  4. Have nurse check IgA/G/M prior to 4th IV infusion  5. Influenza = Tamiflu. Covid = Paxlovid  6. Return to clinic in 6 months or earlier if problem  We will check Anissia's immunoglobulin levels after she completes her third infusion of intravenous immunoglobulin and adjust her dose depending on that level.  She will continue on anti-inflammatory agents for both her upper and lower airway as noted above.  She has a selection of agents to utilize should they be required as noted above.  Assuming she does well we will see her back in this clinic in 6 months or earlier if there is a problem.  Camellia Denis, MD Allergy  / Immunology [facility]

## 2024-02-11 ENCOUNTER — Encounter: Payer: Self-pay | Admitting: Allergy and Immunology

## 2024-02-11 NOTE — Addendum Note (Signed)
 Addended by: Taquilla Downum on: 02/11/2024 11:35 AM   Modules accepted: Orders

## 2024-06-26 ENCOUNTER — Ambulatory Visit: Admitting: Cardiology

## 2024-08-10 ENCOUNTER — Ambulatory Visit: Admitting: Allergy and Immunology
